# Patient Record
Sex: Female | Born: 1975 | Race: White | Hispanic: No | Marital: Married | Smoking: Never smoker
Health system: Southern US, Community
[De-identification: ages and names within clinical notes are randomized; demographics above are authoritative.]

## PROBLEM LIST (undated history)

## (undated) DIAGNOSIS — J302 Other seasonal allergic rhinitis: Secondary | ICD-10-CM

## (undated) DIAGNOSIS — J45909 Unspecified asthma, uncomplicated: Secondary | ICD-10-CM

## (undated) HISTORY — PX: DILATION AND EVACUATION: SHX1459

## (undated) HISTORY — PX: TONSILLECTOMY: SUR1361

---

## 2017-09-11 DIAGNOSIS — J309 Allergic rhinitis, unspecified: Secondary | ICD-10-CM | POA: Diagnosis not present

## 2017-09-11 DIAGNOSIS — Z79899 Other long term (current) drug therapy: Secondary | ICD-10-CM | POA: Diagnosis not present

## 2017-09-11 DIAGNOSIS — J4520 Mild intermittent asthma, uncomplicated: Secondary | ICD-10-CM | POA: Diagnosis not present

## 2018-02-20 DIAGNOSIS — H60312 Diffuse otitis externa, left ear: Secondary | ICD-10-CM | POA: Diagnosis not present

## 2018-03-05 DIAGNOSIS — L409 Psoriasis, unspecified: Secondary | ICD-10-CM | POA: Diagnosis not present

## 2018-03-12 DIAGNOSIS — L409 Psoriasis, unspecified: Secondary | ICD-10-CM | POA: Diagnosis not present

## 2018-03-12 DIAGNOSIS — Z0000 Encounter for general adult medical examination without abnormal findings: Secondary | ICD-10-CM | POA: Diagnosis not present

## 2018-03-19 DIAGNOSIS — E785 Hyperlipidemia, unspecified: Secondary | ICD-10-CM | POA: Diagnosis not present

## 2018-03-19 DIAGNOSIS — Z0000 Encounter for general adult medical examination without abnormal findings: Secondary | ICD-10-CM | POA: Diagnosis not present

## 2018-03-19 DIAGNOSIS — J45909 Unspecified asthma, uncomplicated: Secondary | ICD-10-CM | POA: Diagnosis not present

## 2018-03-19 DIAGNOSIS — L409 Psoriasis, unspecified: Secondary | ICD-10-CM | POA: Diagnosis not present

## 2019-02-17 ENCOUNTER — Ambulatory Visit (HOSPITAL_COMMUNITY)
Admission: EM | Admit: 2019-02-17 | Discharge: 2019-02-17 | Disposition: A | Discharge status: Home / Self Care | Payer: BLUE CROSS/BLUE SHIELD | Attending: Emergency Medicine | Admitting: Emergency Medicine

## 2019-02-17 ENCOUNTER — Encounter (HOSPITAL_COMMUNITY): Payer: Self-pay

## 2019-02-17 ENCOUNTER — Other Ambulatory Visit: Payer: Self-pay

## 2019-02-17 DIAGNOSIS — H60312 Diffuse otitis externa, left ear: Secondary | ICD-10-CM | POA: Diagnosis not present

## 2019-02-17 HISTORY — DX: Unspecified asthma, uncomplicated: J45.909

## 2019-02-17 MED ORDER — NEOMYCIN-POLYMYXIN-HC 3.5-10000-1 OT SUSP: 4.0000 [drp] | Freq: Three times a day (TID) | OTIC | 0 refills | Status: DC

## 2019-02-17 NOTE — Discharge Instructions (Addendum)
Eardrops daily as instructed. Return if ear swelling, pain persists, you develop fever, jaw pain, eye pain, lose hearing. To help prevent ear infections, you may use a 1 part white vinegar and 1 part isopropyl (rubbing) alcohol solution daily to help keep ears clean and dry.  Do not stick Q-tips in your ears.

## 2019-02-17 NOTE — ED Provider Notes (Signed)
[name]    CSN: [id number] Arrival date & time: 02/17/19  1106     History   Chief Complaint Chief Complaint  Patient presents with  . Otalgia    HPI Alice Dominguez is a 43 y.o. female with insignificant medical history presenting for acute left ear pain.  Patient states this is the second day she had pain.  Patient endorses muffled hearing, denies tinnitus or dizziness.  Patient states that for the last few year she tends to get out ear infections annually.  Patient was seen by ENT for similar visit on 02/20/2018.  Given Corticosporin drops which alleviated her symptoms.  Patient states she tolerated this medication well, denies adverse effects.  Patient denies fever, head pain, eye pain, change in vision, jaw pain, difficulty swallowing.  Denies foreign body, recent swimming.    Past Medical History:  Diagnosis Date  . Asthma     There are no active problems to display for this patient.   History reviewed. No pertinent surgical history.  OB History   No obstetric history on file.      Home Medications    Prior to Admission medications   Medication Sig Start Date End Date Taking? Authorizing Provider  neomycin-polymyxin-hydrocortisone (CORTISPORIN) 3.5-10000-1 OTIC suspension Place 4 drops into the left ear 3 (three) times daily. 02/17/19   Hall-Potvin, [place], PA-C    Family History History reviewed. No pertinent family history.  Social History Social History   Tobacco Use  . Smoking status: Never Smoker  . Smokeless tobacco: Never Used  Substance Use Topics  . Alcohol use: Never    Frequency: Never  . Drug use: Never     Allergies   Patient has no known allergies.   Review of Systems Review of Systems as per HPI   Physical Exam Triage Vital Signs ED Triage Vitals  Enc Vitals Group     BP 02/17/19 1135 119/76     Pulse Rate 02/17/19 1135 86     Resp 02/17/19 1135 18     Temp 02/17/19 1135 98.9 F (37.2 C)     Temp Source  02/17/19 1135 Oral     SpO2 02/17/19 1135 99 %     Weight 02/17/19 1132 176 lb 5.9 oz (80 kg)     Height --      Head Circumference --      Peak Flow --      Pain Score 02/17/19 1132 8     Pain Loc --      Pain Edu? --      Excl. in [place]? --    No data found.  Updated Vital Signs BP 119/76 (BP Location: Right Arm)   Pulse 86   Temp 98.9 F (37.2 C) (Oral)   Resp 18   Wt 176 lb 5.9 oz (80 kg)   LMP 01/21/2019   SpO2 99%   Visual Acuity Right Eye Distance:   Left Eye Distance:   Bilateral Distance:    Right Eye Near:   Left Eye Near:    Bilateral Near:     Physical Exam Constitutional:      General: She is not in acute distress. HENT:     Head: Normocephalic and atraumatic.     Right Ear: Tympanic membrane, ear canal and external ear normal. There is no impacted cerumen.     Left Ear: There is no impacted cerumen.     Ears:     Comments: Right ear unremarkable.  Left  ear positive tragal tenderness, EAC swelling.  TM not injected, no perforation holding.    Nose: Nose normal.     Mouth/Throat:     Mouth: Mucous membranes are moist.     Pharynx: Oropharynx is clear. No oropharyngeal exudate or posterior oropharyngeal erythema.     Comments: Tonsils surgically sent, uvula midline rises symmetrically. Eyes:     Extraocular Movements: Extraocular movements intact.     Pupils: Pupils are equal, round, and reactive to light.  Neck:     Musculoskeletal: Normal range of motion and neck supple. No muscular tenderness.  Cardiovascular:     Rate and Rhythm: Normal rate.  Pulmonary:     Effort: Pulmonary effort is normal.  Lymphadenopathy:     Cervical: No cervical adenopathy.  Neurological:     Mental Status: She is alert.      UC Treatments / Results  Labs (all labs ordered are listed, but only abnormal results are displayed) Labs Reviewed - No data to display  EKG None  Radiology No results found.  Procedures Procedures (including critical care time)   Medications Ordered in UC Medications - No data to display  Initial Impression / Assessment and Plan / UC Course  I have reviewed the triage vital signs and the nursing notes.  Pertinent labs & imaging results that were available during my care of the patient were reviewed by me and considered in my medical decision making (see chart for details).     43 year old female with acute left-sided ear pain.  Patient states she is get these annually.  Has responded well to Corticosporin drops.  TM without perforation.  Discussed return precautions, eardrops given. Final Clinical Impressions(s) / UC Diagnoses   Final diagnoses:  Acute diffuse otitis externa of left ear     Discharge Instructions     Eardrops daily as instructed. Return if ear swelling, pain persists, you develop fever, jaw pain, eye pain, lose hearing. To help prevent ear infections, you may use a 1 part white vinegar and 1 part isopropyl (rubbing) alcohol solution daily to help keep ears clean and dry.  Do not stick Q-tips in your ears.    ED Prescriptions    Medication Sig Dispense Auth. Provider   neomycin-polymyxin-hydrocortisone (CORTISPORIN) 3.5-10000-1 OTIC suspension Place 4 drops into the left ear 3 (three) times daily. 10 mL Hall-Potvin, [place], PA-C     Controlled Substance Prescriptions [place] Controlled Substance Registry consulted? Not Applicable   Alice Dominguez, [place] 02/17/19 1155

## 2019-02-17 NOTE — ED Notes (Signed)
Patient able to ambulate independently  

## 2019-02-17 NOTE — ED Triage Notes (Signed)
Pt cc she has left ear pain. Pt states she could not sleep for the pain. Pt states its swollen. X 2 days

## 2019-02-19 ENCOUNTER — Ambulatory Visit (HOSPITAL_COMMUNITY)
Admission: EM | Admit: 2019-02-19 | Discharge: 2019-02-19 | Disposition: A | Discharge status: Home / Self Care | Payer: BLUE CROSS/BLUE SHIELD | Attending: Family Medicine | Admitting: Family Medicine

## 2019-02-19 ENCOUNTER — Encounter (HOSPITAL_COMMUNITY): Payer: Self-pay | Admitting: Emergency Medicine

## 2019-02-19 ENCOUNTER — Other Ambulatory Visit: Payer: Self-pay

## 2019-02-19 DIAGNOSIS — H60502 Unspecified acute noninfective otitis externa, left ear: Secondary | ICD-10-CM

## 2019-02-19 DIAGNOSIS — H9202 Otalgia, left ear: Secondary | ICD-10-CM

## 2019-02-19 MED ORDER — CIPROFLOXACIN HCL 500 MG PO TABS: 500.0000 mg | ORAL_TABLET | Freq: Two times a day (BID) | ORAL | 0 refills | Status: DC

## 2019-02-19 MED ORDER — HYDROCODONE-ACETAMINOPHEN 7.5-325 MG PO TABS: 1.0000 | ORAL_TABLET | Freq: Four times a day (QID) | ORAL | 0 refills | Status: DC | PRN

## 2019-02-19 NOTE — Discharge Instructions (Addendum)
Continue the eardrops Add Cipro 2 times a day Take Tylenol or ibuprofen for mild to moderate pain Take hydrocodone if pain is severe Hydrocodone can cause drowsiness, do not take this pill and drive See your ear nose and throat doctor next week Call if you do not start to see improvement within 2 to 3 days

## 2019-02-19 NOTE — ED Provider Notes (Signed)
[place]    CSN: [id number] Arrival date & time: 02/19/19  1837     History   Chief Complaint Chief Complaint  Patient presents with  . Otalgia    HPI Alice Dominguez is a 43 y.o. female.   HPI  Seen 3 d ago for OE Is using the cortisporin gtts as directed Is getting worse Is having increased pain.  Increased redness and swelling of the ear.  Increased drainage.  Decreased hearing.  She states the pain is keeping her awake at night.  The drops do not seem to be working  Past Medical History:  Diagnosis Date  . Asthma     There are no active problems to display for this patient.   Past Surgical History:  Procedure Laterality Date  . TONSILLECTOMY      OB History   No obstetric history on file.      Home Medications    Prior to Admission medications   Medication Sig Start Date End Date Taking? Authorizing Provider  ciprofloxacin (CIPRO) 500 MG tablet Take 1 tablet (500 mg total) by mouth every 12 (twelve) hours. 02/19/19   Raylene Everts, MD  HYDROcodone-acetaminophen ([place]) 7.5-325 MG tablet Take 1 tablet by mouth every 6 (six) hours as needed for moderate pain. 02/19/19   Raylene Everts, MD  neomycin-polymyxin-hydrocortisone (CORTISPORIN) 3.5-10000-1 OTIC suspension Place 4 drops into the left ear 3 (three) times daily. 02/17/19   Hall-Potvin, [place], PA-C    Family History History reviewed. No pertinent family history.  Social History Social History   Tobacco Use  . Smoking status: Never Smoker  . Smokeless tobacco: Never Used  Substance Use Topics  . Alcohol use: Never    Frequency: Never  . Drug use: Never     Allergies   Patient has no known allergies.   Review of Systems Review of Systems  Constitutional: Negative for chills and fever.  HENT: Positive for ear discharge, ear pain and hearing loss. Negative for sore throat.   Eyes: Negative for pain and visual disturbance.  Respiratory: Negative for cough and  shortness of breath.   Cardiovascular: Negative for chest pain and palpitations.  Gastrointestinal: Negative for abdominal pain and vomiting.  Genitourinary: Negative for dysuria and hematuria.  Musculoskeletal: Negative for arthralgias and back pain.  Skin: Negative for color change and rash.  Neurological: Negative for seizures and syncope.  All other systems reviewed and are negative.    Physical Exam Triage Vital Signs ED Triage Vitals  Enc Vitals Group     BP 02/19/19 1848 131/63     Pulse Rate 02/19/19 1848 73     Resp 02/19/19 1848 18     Temp 02/19/19 1848 98.4 F (36.9 C)     Temp Source 02/19/19 1848 Oral     SpO2 02/19/19 1848 100 %     Weight --      Height --      Head Circumference --      Peak Flow --      Pain Score 02/19/19 1846 9     Pain Loc --      Pain Edu? --      Excl. in [place]? --    No data found.  Updated Vital Signs BP 131/63 (BP Location: Right Arm)   Pulse 73   Temp 98.4 F (36.9 C) (Oral)   Resp 18   LMP 01/21/2019   SpO2 100%       Physical Exam Constitutional:  General: She is not in acute distress.    Appearance: She is well-developed.  HENT:     Head: Normocephalic and atraumatic.     Right Ear: Tympanic membrane, ear canal and external ear normal.     Left Ear: Tympanic membrane normal. There is no impacted cerumen.     Ears:      Nose: Nose normal. No congestion.     Mouth/Throat:     Mouth: Mucous membranes are moist.     Pharynx: No posterior oropharyngeal erythema.  Eyes:     Conjunctiva/sclera: Conjunctivae normal.     Pupils: Pupils are equal, round, and reactive to light.  Neck:     Musculoskeletal: Normal range of motion.     Comments: Left anterior cervical node Cardiovascular:     Rate and Rhythm: Normal rate.  Pulmonary:     Effort: Pulmonary effort is normal. No respiratory distress.  Abdominal:     General: There is no distension.     Palpations: Abdomen is soft.  Musculoskeletal: Normal range of  motion.  Lymphadenopathy:     Cervical: Cervical adenopathy present.  Skin:    General: Skin is warm and dry.  Neurological:     General: No focal deficit present.     Mental Status: She is alert.  Psychiatric:        Mood and Affect: Mood normal.        Behavior: Behavior normal.      UC Treatments / Results  Labs (all labs ordered are listed, but only abnormal results are displayed) Labs Reviewed - No data to display  EKG None  Radiology No results found.  Procedures Procedures (including critical care time)  Medications Ordered in UC Medications - No data to display  Initial Impression / Assessment and Plan / UC Course  I have reviewed the triage vital signs and the nursing notes.  Pertinent labs & imaging results that were available during my care of the patient were reviewed by me and considered in my medical decision making (see chart for details).     Otitis externa with failure of Cortisporin.  Will add Cipro.  Pain management.  Patient has an appointment with her ENT on 6 /1 /2020.  She will call us if she fails to see improvement over the next couple of days Final Clinical Impressions(s) / UC Diagnoses   Final diagnoses:  Acute otitis externa of left ear, unspecified type  Left ear pain     Discharge Instructions     Continue the eardrops Add Cipro 2 times a day Take Tylenol or ibuprofen for mild to moderate pain Take hydrocodone if pain is severe Hydrocodone can cause drowsiness, do not take this pill and drive See your ear nose and throat doctor next week Call if you do not start to see improvement within 2 to 3 days   ED Prescriptions    Medication Sig Dispense Auth. Provider   ciprofloxacin (CIPRO) 500 MG tablet Take 1 tablet (500 mg total) by mouth every 12 (twelve) hours. 14 tablet Raylene Everts, MD   HYDROcodone-acetaminophen [facility]) 7.5-325 MG tablet Take 1 tablet by mouth every 6 (six) hours as needed for moderate pain. 15 tablet  Raylene Everts, MD     Controlled Substance Prescriptions [place] Controlled Substance Registry consulted? Yes, I have consulted the [place] Controlled Substances Registry for this patient, and feel the risk/benefit ratio today is favorable for proceeding with this prescription for a controlled substance.   Meda Coffee,  Jennette Banker, MD 02/19/19 [phone]

## 2019-02-19 NOTE — ED Triage Notes (Signed)
Left ear pain, difficulty hearing that started on Saturday.  Seen at ucc on Sunday.  Symptoms have worsened.

## 2019-02-25 DIAGNOSIS — H60312 Diffuse otitis externa, left ear: Secondary | ICD-10-CM | POA: Diagnosis not present

## 2019-10-29 ENCOUNTER — Other Ambulatory Visit: Payer: Self-pay | Admitting: Family Medicine

## 2019-10-29 DIAGNOSIS — Z1231 Encounter for screening mammogram for malignant neoplasm of breast: Secondary | ICD-10-CM

## 2019-12-04 ENCOUNTER — Ambulatory Visit
Admission: RE | Admit: 2019-12-04 | Discharge: 2019-12-04 | Disposition: A | Discharge status: Home / Self Care | Payer: BC Managed Care – PPO | Source: Ambulatory Visit | Attending: Family Medicine | Admitting: Family Medicine

## 2019-12-04 ENCOUNTER — Other Ambulatory Visit: Payer: Self-pay

## 2019-12-04 DIAGNOSIS — Z1231 Encounter for screening mammogram for malignant neoplasm of breast: Secondary | ICD-10-CM

## 2019-12-07 ENCOUNTER — Ambulatory Visit (HOSPITAL_COMMUNITY)
Admission: EM | Admit: 2019-12-07 | Discharge: 2019-12-07 | Disposition: A | Discharge status: Home / Self Care | Payer: BC Managed Care – PPO | Attending: Emergency Medicine | Admitting: Emergency Medicine

## 2019-12-07 ENCOUNTER — Other Ambulatory Visit: Payer: Self-pay

## 2019-12-07 ENCOUNTER — Encounter (HOSPITAL_COMMUNITY): Payer: Self-pay | Admitting: *Deleted

## 2019-12-07 DIAGNOSIS — H02843 Edema of right eye, unspecified eyelid: Secondary | ICD-10-CM | POA: Diagnosis not present

## 2019-12-07 DIAGNOSIS — T7840XA Allergy, unspecified, initial encounter: Secondary | ICD-10-CM | POA: Diagnosis not present

## 2019-12-07 DIAGNOSIS — H02846 Edema of left eye, unspecified eyelid: Secondary | ICD-10-CM | POA: Diagnosis not present

## 2019-12-07 MED ORDER — PREDNISONE 10 MG (21) PO TBPK: ORAL_TABLET | ORAL | 0 refills | Status: DC

## 2019-12-07 MED ORDER — OLOPATADINE HCL 0.2 % OP SOLN: 1.0000 [drp] | Freq: Every day | OPHTHALMIC | 0 refills | Status: DC

## 2019-12-07 NOTE — ED Provider Notes (Signed)
HPI  SUBJECTIVE:  Alice Dominguez is a 44 y.o. female who presents with bilateral eyelid erythema, swelling, dry skin.  She reports burning, itching periorbital pain.  No sneezing nasal congestion or rhinorrhea.  No new lotions soaps exposure to chemicals.  States that she is not rubbing her eyes.  No photophobia, eye pain.  No visual changes nausea vomiting fevers headaches.  No tongue or lip swelling, rash urticaria difficulty breathing wheezing. she has had symptoms like this before that lasted a year and no cause was found.  She has tried Claritin without improvement in her symptoms.  Symptoms are worse with going outside.  She has a past medical history of allergies which are worse in the spring, asthma.  No history of diabetes hypertension.  LMP: Beginning of March.  Denies the possibility of being pregnant.  PMD: Dr. Theda Sers.  Past Medical History:  Diagnosis Date   Asthma     Past Surgical History:  Procedure Laterality Date   DILATION AND EVACUATION     miscarriage   TONSILLECTOMY      Family History  Problem Relation Age of Onset   Healthy Mother    Healthy Father     Social History   Tobacco Use   Smoking status: Never Smoker   Smokeless tobacco: Never Used  Substance Use Topics   Alcohol use: Never   Drug use: Never    No current facility-administered medications for this encounter.  Current Outpatient Medications:    HYDROcodone-acetaminophen (NORCO) 7.5-325 MG tablet, Take 1 tablet by mouth every 6 (six) hours as needed for moderate pain., Disp: 15 tablet, Rfl: 0   Olopatadine HCl 0.2 % SOLN, Apply 1 drop to eye daily., Disp: 2.5 mL, Rfl: 0   predniSONE (STERAPRED UNI-PAK 21 TAB) 10 MG (21) TBPK tablet, Dispense one 6 day pack. Take as directed with food., Disp: 21 tablet, Rfl: 0  Allergies  Allergen Reactions   Penicillins Rash     ROS  As noted in HPI.   Physical Exam  BP 121/83 (BP Location: Left Arm)    Pulse 83    Temp 98.7 F  (37.1 C) (Oral)    Resp 18    LMP 11/25/2019    SpO2 97%   Constitutional: Well developed, well nourished, no acute distress Eyes:  EOMI, conjunctiva normal bilaterally, PERRLA, no photophobia.  Bilateral mildly tender erythema edema of the upper eyelids and inferior to the eye.  No rash, induration    HENT: Normocephalic, atraumatic,mucus membranes moist no angioedema of lips tongue or uvula Respiratory: Normal inspiratory effort lungs clear bilaterally Cardiovascular: Normal rate GI: nondistended skin: No rash, skin intact Musculoskeletal: no deformities Neurologic: Alert & oriented x 3, no focal neuro deficits Psychiatric: Speech and behavior appropriate   ED Course   Medications - No data to display  No orders of the defined types were placed in this encounter.   No results found for this or any previous visit (from the past 24 hour(s)). No results found.  ED Clinical Impression  1. Allergic reaction, initial encounter   2. Edema of eyelid of both eyes      ED Assessment/Plan  Presentation consistent with allergies.  Doubt periorbital cellulitis as it is bilateral.  Will send home with Allegra cool compresses Patanol, 6-day prednisone taper. Follow-up with PMD as needed, to the ER if she gets worse.  Discussed MDM, treatment plan, and plan for follow-up with patient. Discussed sn/sx that should prompt return to the ED. patient agrees  with plan.   Meds ordered this encounter  Medications   Olopatadine HCl 0.2 % SOLN    Sig: Apply 1 drop to eye daily.    Dispense:  2.5 mL    Refill:  0   predniSONE (STERAPRED UNI-PAK 21 TAB) 10 MG (21) TBPK tablet    Sig: Dispense one 6 day pack. Take as directed with food.    Dispense:  21 tablet    Refill:  0    *This clinic note was created using [employer]. Therefore, there may be occasional mistakes despite careful proofreading.   ?    Melynda Ripple, MD 12/07/19 1224

## 2019-12-07 NOTE — ED Triage Notes (Signed)
Pt reports getting seasonal allergies during spring.  Reports starting with bilat swollen eyes, eye redness progressively worsening over past week.  Has started using Claritin without relief.

## 2019-12-07 NOTE — Discharge Instructions (Addendum)
Stop the Claritin and try Allegra, cool compresses, Pataday, 6-day prednisone taper.

## 2019-12-16 ENCOUNTER — Encounter (HOSPITAL_COMMUNITY): Payer: Self-pay | Admitting: Emergency Medicine

## 2019-12-16 ENCOUNTER — Ambulatory Visit (HOSPITAL_COMMUNITY)
Admission: EM | Admit: 2019-12-16 | Discharge: 2019-12-16 | Disposition: A | Discharge status: Home / Self Care | Payer: BC Managed Care – PPO | Attending: Physician Assistant | Admitting: Physician Assistant

## 2019-12-16 ENCOUNTER — Other Ambulatory Visit: Payer: Self-pay

## 2019-12-16 DIAGNOSIS — H1013 Acute atopic conjunctivitis, bilateral: Secondary | ICD-10-CM | POA: Diagnosis not present

## 2019-12-16 MED ORDER — MONTELUKAST SODIUM 10 MG PO TABS: 10.0000 mg | ORAL_TABLET | Freq: Every day | ORAL | 0 refills | Status: DC

## 2019-12-16 MED ORDER — HYDROXYZINE HCL 25 MG PO TABS: 25.0000 mg | ORAL_TABLET | Freq: Three times a day (TID) | ORAL | 0 refills | Status: AC | PRN

## 2019-12-16 NOTE — ED Triage Notes (Signed)
Pt sts seasonal allergies during the spring; pt sts eyes are red, puffy and watering; pt sts hx of same; pt seen here recently for same

## 2019-12-16 NOTE — Discharge Instructions (Signed)
Take the Singulair once a day Use the hydroxyzine for itching, this may make you drowsy so primarily uses at night  Please follow-up with your primary care for reevaluation in 1 to 2 weeks  If swelling is severely worsening in 1 week please return for reevaluation or follow-up with primary care

## 2019-12-16 NOTE — ED Provider Notes (Signed)
[place]    CSN: [id number] Arrival date & time: 12/16/19  [id number]      History   Chief Complaint Chief Complaint  Patient presents with  . Allergic Reaction    HPI Alice Dominguez is a 44 y.o. female.   Patient is here for evaluation of eyelid redness and swelling.  She was here on 12/07/2019 for similar.  Patient reports finishing her prednisone taper on schedule.  She reports immediate improvement in her redness and swelling around her eyes after 2 days of prednisone treatment.  She reports 2 to 3 days after finishing her treatment swelling and redness return.  She does endorse some irritation and has tried to not scratch her eyes.  Denies change in her vision.  She denies eye watering or discharge.  She denies fever or chills.  She reports similar episodes many years ago that was allergy driven.  She reports she different specialists and no one was able to give her a reason why it happened.  She reports that after that summer concluded her symptoms never returned.     Past Medical History:  Diagnosis Date  . Asthma     There are no problems to display for this patient.   Past Surgical History:  Procedure Laterality Date  . DILATION AND EVACUATION     miscarriage  . TONSILLECTOMY      OB History   No obstetric history on file.      Home Medications    Prior to Admission medications   Medication Sig Start Date End Date Taking? Authorizing Provider  HYDROcodone-acetaminophen (NORCO) 7.5-325 MG tablet Take 1 tablet by mouth every 6 (six) hours as needed for moderate pain. Patient not taking: Reported on 12/16/2019 02/19/19   Raylene Everts, MD  hydrOXYzine (ATARAX/VISTARIL) 25 MG tablet Take 1 tablet (25 mg total) by mouth every 8 (eight) hours as needed for up to 10 days for itching. 12/16/19 12/26/19  Kostantinos Tallman, Marguerita Beards, PA-C  montelukast (SINGULAIR) 10 MG tablet Take 1 tablet (10 mg total) by mouth at bedtime. 12/16/19 01/15/20  Shardea Cwynar, Marguerita Beards, PA-C    Olopatadine HCl 0.2 % SOLN Apply 1 drop to eye daily. 12/07/19   Melynda Ripple, MD  predniSONE (STERAPRED UNI-PAK 21 TAB) 10 MG (21) TBPK tablet Dispense one 6 day pack. Take as directed with food. 12/07/19   Melynda Ripple, MD    Family History Family History  Problem Relation Age of Onset  . Healthy Mother   . Healthy Father     Social History Social History   Tobacco Use  . Smoking status: Never Smoker  . Smokeless tobacco: Never Used  Substance Use Topics  . Alcohol use: Never  . Drug use: Never     Allergies   Penicillins   Review of Systems Review of Systems  Constitutional: Negative for chills and fever.  Eyes: Positive for redness and itching. Negative for photophobia, pain, discharge and visual disturbance.  Respiratory: Negative for cough and shortness of breath.   Neurological: Negative for headaches.     Physical Exam Triage Vital Signs ED Triage Vitals  Enc Vitals Group     BP 12/16/19 1046 128/85     Pulse Rate 12/16/19 1046 79     Resp 12/16/19 1046 18     Temp 12/16/19 1046 98.4 F (36.9 C)     Temp Source 12/16/19 1046 Oral     SpO2 12/16/19 1046 97 %     Weight --  Height --      Head Circumference --      Peak Flow --      Pain Score 12/16/19 1047 3     Pain Loc --      Pain Edu? --      Excl. in [place]? --    No data found.  Updated Vital Signs BP 128/85 (BP Location: Right Arm)   Pulse 79   Temp 98.4 F (36.9 C) (Oral)   Resp 18   LMP 11/25/2019   SpO2 97%   Visual Acuity Right Eye Distance:   Left Eye Distance:   Bilateral Distance:    Right Eye Near:   Left Eye Near:    Bilateral Near:     Physical Exam Vitals and nursing note reviewed.  Constitutional:      General: She is not in acute distress.    Appearance: She is well-developed.  HENT:     Head: Normocephalic and atraumatic.  Eyes:     Conjunctiva/sclera: Conjunctivae normal.     Comments: There is erythema and mild swelling around both eyes to  include the superior eyelid and skin lateral through eyes.  There is no scleral injection or chemosis.  Nontender to palpation.  See note from 12/07/2019 for image as this is very similar  Cardiovascular:     Rate and Rhythm: Normal rate.     Heart sounds: No murmur.  Pulmonary:     Effort: Pulmonary effort is normal. No respiratory distress.  Musculoskeletal:     Cervical back: Neck supple.     Right lower leg: No edema.     Left lower leg: No edema.  Skin:    General: Skin is warm and dry.  Neurological:     Mental Status: She is alert.      UC Treatments / Results  Labs (all labs ordered are listed, but only abnormal results are displayed) Labs Reviewed - No data to display  EKG   Radiology No results found.  Procedures Procedures (including critical care time)  Medications Ordered in UC Medications - No data to display  Initial Impression / Assessment and Plan / UC Course  I have reviewed the triage vital signs and the nursing notes.  Pertinent labs & imaging results that were available during my care of the patient were reviewed by me and considered in my medical decision making (see chart for details).     #Allergy Patient is a 44 year old presenting with eye redness and swelling that is most likely consistent with allergy and histamine response.  Unclear exactly what is driving this however do believe there is some aspect of mechanical irritation causing skin irritation and swelling around the eyes.  We will treat the allergy with Singulair as antihistamine treatment has not greatly improved symptoms.  Do not want patient to be on long-term steroids at this time and will avoid that given recent steroid use. -Hydroxyzine for itching as well at night -Return precautions were discussed patient verbalized understanding -Patient to establish primary care  Case discussed and patient examined with supervising physician.  Plan of care was also discussed with supervising  physician.   Final Clinical Impressions(s) / UC Diagnoses   Final diagnoses:  Allergic conjunctivitis of both eyes     Discharge Instructions     Take the Singulair once a day Use the hydroxyzine for itching, this may make you drowsy so primarily uses at night  Please follow-up with your primary care for reevaluation in 1  to 2 weeks  If swelling is severely worsening in 1 week please return for reevaluation or follow-up with primary care      ED Prescriptions    Medication Sig Dispense Auth. Provider   montelukast (SINGULAIR) 10 MG tablet Take 1 tablet (10 mg total) by mouth at bedtime. 30 tablet Amanuel Sinkfield, Marguerita Beards, PA-C   hydrOXYzine (ATARAX/VISTARIL) 25 MG tablet Take 1 tablet (25 mg total) by mouth every 8 (eight) hours as needed for up to 10 days for itching. 12 tablet Trini Soldo, Marguerita Beards, PA-C     I have reviewed the PDMP during this encounter.   Purnell Shoemaker, PA-C 12/16/19 1534

## 2020-01-07 DIAGNOSIS — Z23 Encounter for immunization: Secondary | ICD-10-CM | POA: Diagnosis not present

## 2020-01-07 DIAGNOSIS — J45909 Unspecified asthma, uncomplicated: Secondary | ICD-10-CM | POA: Diagnosis not present

## 2020-01-07 DIAGNOSIS — H1013 Acute atopic conjunctivitis, bilateral: Secondary | ICD-10-CM | POA: Diagnosis not present

## 2020-02-12 DIAGNOSIS — Z01419 Encounter for gynecological examination (general) (routine) without abnormal findings: Secondary | ICD-10-CM | POA: Diagnosis not present

## 2020-02-12 DIAGNOSIS — Z1151 Encounter for screening for human papillomavirus (HPV): Secondary | ICD-10-CM | POA: Diagnosis not present

## 2020-02-12 DIAGNOSIS — Z6827 Body mass index (BMI) 27.0-27.9, adult: Secondary | ICD-10-CM | POA: Diagnosis not present

## 2020-04-19 ENCOUNTER — Encounter (HOSPITAL_COMMUNITY): Payer: Self-pay

## 2020-04-19 ENCOUNTER — Ambulatory Visit (HOSPITAL_COMMUNITY)
Admission: EM | Admit: 2020-04-19 | Discharge: 2020-04-19 | Disposition: A | Discharge status: Home / Self Care | Payer: BC Managed Care – PPO | Attending: Family Medicine | Admitting: Family Medicine

## 2020-04-19 DIAGNOSIS — I889 Nonspecific lymphadenitis, unspecified: Secondary | ICD-10-CM | POA: Diagnosis not present

## 2020-04-19 DIAGNOSIS — H60392 Other infective otitis externa, left ear: Secondary | ICD-10-CM | POA: Diagnosis not present

## 2020-04-19 MED ORDER — CIPROFLOXACIN-DEXAMETHASONE 0.3-0.1 % OT SUSP: 4.0000 [drp] | Freq: Two times a day (BID) | OTIC | 0 refills | Status: DC

## 2020-04-19 MED ORDER — CEFDINIR 300 MG PO CAPS: 300.0000 mg | ORAL_CAPSULE | Freq: Two times a day (BID) | ORAL | 0 refills | Status: DC

## 2020-04-19 MED ORDER — CEFDINIR 300 MG PO CAPS: 300.0000 mg | ORAL_CAPSULE | Freq: Two times a day (BID) | ORAL | 0 refills | Status: AC

## 2020-04-19 NOTE — ED Provider Notes (Signed)
[place]   [id number] 04/19/20 Arrival Time: 1330  CC: EAR PAIN  SUBJECTIVE: History from: patient.  Alice Dominguez is a 44 y.o. female who presents with left ear pain for the last 2 days. Reports history of ear infections. Reports that she has used OTC ear drops with no relief. Denies a precipitating event, such as swimming or wearing ear plugs. Patient states the pain is constant and achy in character.  Denies fever, chills, fatigue, sinus pain, rhinorrhea, ear discharge, sore throat, SOB, wheezing, chest pain, nausea, changes in bowel or bladder habits.    ROS: As per HPI.  All other pertinent ROS negative.     Past Medical History:  Diagnosis Date  . Asthma    Past Surgical History:  Procedure Laterality Date  . DILATION AND EVACUATION     miscarriage  . TONSILLECTOMY     Allergies  Allergen Reactions  . Penicillins Rash   No current facility-administered medications on file prior to encounter.   Current Outpatient Medications on File Prior to Encounter  Medication Sig Dispense Refill  . HYDROcodone-acetaminophen (NORCO) 7.5-325 MG tablet Take 1 tablet by mouth every 6 (six) hours as needed for moderate pain. (Patient not taking: Reported on 12/16/2019) 15 tablet 0  . montelukast (SINGULAIR) 10 MG tablet Take 1 tablet (10 mg total) by mouth at bedtime. 30 tablet 0  . Olopatadine HCl 0.2 % SOLN Apply 1 drop to eye daily. 2.5 mL 0  . predniSONE (STERAPRED UNI-PAK 21 TAB) 10 MG (21) TBPK tablet Dispense one 6 day pack. Take as directed with food. 21 tablet 0   Social History   Socioeconomic History  . Marital status: Married    Spouse name: Not on file  . Number of children: Not on file  . Years of education: Not on file  . Highest education level: Not on file  Occupational History  . Not on file  Tobacco Use  . Smoking status: Never Smoker  . Smokeless tobacco: Never Used  Substance and Sexual Activity  . Alcohol use: Never  . Drug use: Never  .  Sexual activity: Not on file  Other Topics Concern  . Not on file  Social History Narrative  . Not on file   Social Determinants of Health   Financial Resource Strain:   . Difficulty of Paying Living Expenses:   Food Insecurity:   . Worried About Charity fundraiser in the Last Year:   . Arboriculturist in the Last Year:   Transportation Needs:   . Film/video editor (Medical):   Marland Kitchen Lack of Transportation (Non-Medical):   Physical Activity:   . Days of Exercise per Week:   . Minutes of Exercise per Session:   Stress:   . Feeling of Stress :   Social Connections:   . Frequency of Communication with Friends and Family:   . Frequency of Social Gatherings with Friends and Family:   . Attends Religious Services:   . Active Member of Clubs or Organizations:   . Attends Archivist Meetings:   Marland Kitchen Marital Status:   Intimate Partner Violence:   . Fear of Current or Ex-Partner:   . Emotionally Abused:   Marland Kitchen Physically Abused:   . Sexually Abused:    Family History  Problem Relation Age of Onset  . Healthy Mother   . Healthy Father     OBJECTIVE:  Vitals:   04/19/20 1438  BP: 119/80  Pulse: 69  Resp:  16  Temp: 98.3 F (36.8 C)  TempSrc: Oral  SpO2: 98%     General appearance: alert; appears fatigued HEENT: Ears: R EAC clear, L EAC erythematous, tender, swollen, TMs pearly gray with visible cone of light, without erythema; Eyes: PERRL, EOMI grossly; Sinuses nontender to palpation; Nose: clear rhinorrhea; Throat: oropharynx mildly erythematous, tonsils 1+ without white tonsillar exudates, uvula midline Neck: supple with LAD Lungs: unlabored respirations, symmetrical air entry; cough: absent; no respiratory distress Heart: regular rate and rhythm.  Radial pulses 2+ symmetrical bilaterally Skin: warm and dry Psychological: alert and cooperative; normal mood and affect  Imaging: No results found.   ASSESSMENT & PLAN:  1. Other infective acute otitis externa  of left ear   2. Lymphadenitis     Meds ordered this encounter  Medications  . DISCONTD: ciprofloxacin-dexamethasone (CIPRODEX) OTIC suspension    Sig: Place 4 drops into the left ear 2 (two) times daily.    Dispense:  7.5 mL    Refill:  0    Order Specific Question:   Supervising Provider    Answer:   Chase Picket [id number]  . DISCONTD: cefdinir (OMNICEF) 300 MG capsule    Sig: Take 1 capsule (300 mg total) by mouth 2 (two) times daily for 10 days.    Dispense:  20 capsule    Refill:  0    Order Specific Question:   Supervising Provider    Answer:   Chase Picket [id number]  . cefdinir (OMNICEF) 300 MG capsule    Sig: Take 1 capsule (300 mg total) by mouth 2 (two) times daily for 10 days.    Dispense:  20 capsule    Refill:  0    Order Specific Question:   Supervising Provider    Answer:   Chase Picket [id number]  . ciprofloxacin-dexamethasone (CIPRODEX) OTIC suspension    Sig: Place 4 drops into the left ear 2 (two) times daily.    Dispense:  7.5 mL    Refill:  0    Order Specific Question:   Supervising Provider    Answer:   Chase Picket [[id number]]    Rest and drink plenty of fluids Prescribed Cipro Prescribed ciprofloxacin ear drops Take medications as directed and to completion Continue to use OTC ibuprofen and/ or tylenol as needed for pain control Follow up with PCP if symptoms persists Return here or go to the ER if you have any new or worsening symptoms   Reviewed expectations re: course of current medical issues. Questions answered. Outlined signs and symptoms indicating need for more acute intervention. Patient verbalized understanding. After Visit Summary given.         Faustino Congress, NP 04/23/20 [phone]

## 2020-04-19 NOTE — Discharge Instructions (Addendum)
I have sent in Ciprodex drops for you to use 4 drops in the left ear twice daily for one week  I have also sent in Cefdinir for you to take orally twice a day for 10 days  Follow up with this office or with primary care as needed  Follow up in the ER for unrelenting pain, high fever, trouble swallowing, trouble breathing, other concerning symptoms

## 2020-04-19 NOTE — ED Triage Notes (Signed)
Pt present left ear pain, symptoms started two days ago.pt states that she has tried medication and drops without any relief.

## 2020-06-17 DIAGNOSIS — Z029 Encounter for administrative examinations, unspecified: Secondary | ICD-10-CM | POA: Diagnosis not present

## 2020-06-25 DIAGNOSIS — Z23 Encounter for immunization: Secondary | ICD-10-CM | POA: Diagnosis not present

## 2020-07-23 DIAGNOSIS — J45909 Unspecified asthma, uncomplicated: Secondary | ICD-10-CM | POA: Diagnosis not present

## 2020-07-23 DIAGNOSIS — Z23 Encounter for immunization: Secondary | ICD-10-CM | POA: Diagnosis not present

## 2020-07-23 DIAGNOSIS — K644 Residual hemorrhoidal skin tags: Secondary | ICD-10-CM | POA: Diagnosis not present

## 2020-11-13 ENCOUNTER — Encounter (HOSPITAL_COMMUNITY): Payer: Self-pay | Admitting: Emergency Medicine

## 2020-11-13 ENCOUNTER — Emergency Department (HOSPITAL_COMMUNITY)
Admission: EM | Admit: 2020-11-13 | Discharge: 2020-11-13 | Disposition: A | Discharge status: Home / Self Care | Payer: BC Managed Care – PPO | Attending: Emergency Medicine | Admitting: Emergency Medicine

## 2020-11-13 ENCOUNTER — Emergency Department (HOSPITAL_COMMUNITY): Payer: BC Managed Care – PPO

## 2020-11-13 DIAGNOSIS — R0789 Other chest pain: Secondary | ICD-10-CM | POA: Diagnosis not present

## 2020-11-13 DIAGNOSIS — Z20822 Contact with and (suspected) exposure to covid-19: Secondary | ICD-10-CM | POA: Insufficient documentation

## 2020-11-13 DIAGNOSIS — J45909 Unspecified asthma, uncomplicated: Secondary | ICD-10-CM | POA: Diagnosis not present

## 2020-11-13 DIAGNOSIS — R000 Tachycardia, unspecified: Secondary | ICD-10-CM | POA: Insufficient documentation

## 2020-11-13 DIAGNOSIS — R0602 Shortness of breath: Secondary | ICD-10-CM | POA: Diagnosis not present

## 2020-11-13 LAB — CBC WITH DIFFERENTIAL/PLATELET
Abs Immature Granulocytes: 0.02 10*3/uL (ref 0.00–0.07)
Basophils Absolute: 0.1 10*3/uL (ref 0.0–0.1)
Basophils Relative: 1 %
Eosinophils Absolute: 0.1 10*3/uL (ref 0.0–0.5)
Eosinophils Relative: 1 %
HCT: 42.8 % (ref 36.0–46.0)
Hemoglobin: 14.5 g/dL (ref 12.0–15.0)
Immature Granulocytes: 0 %
Lymphocytes Relative: 19 %
Lymphs Abs: 1.3 10*3/uL (ref 0.7–4.0)
MCH: 30.3 pg (ref 26.0–34.0)
MCHC: 33.9 g/dL (ref 30.0–36.0)
MCV: 89.4 fL (ref 80.0–100.0)
Monocytes Absolute: 0.5 10*3/uL (ref 0.1–1.0)
Monocytes Relative: 7 %
Neutro Abs: 4.9 10*3/uL (ref 1.7–7.7)
Neutrophils Relative %: 72 %
Platelets: 257 10*3/uL (ref 150–400)
RBC: 4.79 MIL/uL (ref 3.87–5.11)
RDW: 12.8 % (ref 11.5–15.5)
WBC: 6.8 10*3/uL (ref 4.0–10.5)
nRBC: 0 % (ref 0.0–0.2)

## 2020-11-13 LAB — COMPREHENSIVE METABOLIC PANEL
ALT: 25 U/L (ref 0–44)
AST: 20 U/L (ref 15–41)
Albumin: 4.5 g/dL (ref 3.5–5.0)
Alkaline Phosphatase: 61 U/L (ref 38–126)
Anion gap: 10 (ref 5–15)
BUN: 11 mg/dL (ref 6–20)
CO2: 24 mmol/L (ref 22–32)
Calcium: 9.4 mg/dL (ref 8.9–10.3)
Chloride: 105 mmol/L (ref 98–111)
Creatinine, Ser: 0.78 mg/dL (ref 0.44–1.00)
GFR, Estimated: >60 mL/min (ref >60)
Glucose, Bld: 105 mg/dL — ABNORMAL HIGH (ref 70–99)
Potassium: 4 mmol/L (ref 3.5–5.1)
Sodium: 139 mmol/L (ref 135–145)
Total Bilirubin: 0.8 mg/dL (ref 0.3–1.2)
Total Protein: 7.3 g/dL (ref 6.5–8.1)

## 2020-11-13 LAB — I-STAT BETA HCG BLOOD, ED (MC, WL, AP ONLY): I-stat hCG, quantitative: <5 m[IU]/mL (ref <5)

## 2020-11-13 LAB — SARS CORONAVIRUS 2 (TAT 6-24 HRS): SARS Coronavirus 2: NEGATIVE

## 2020-11-13 LAB — D-DIMER, QUANTITATIVE: D-Dimer, Quant: 0.43 ug/mL-FEU (ref 0.00–0.50)

## 2020-11-13 LAB — TROPONIN I (HIGH SENSITIVITY)
Troponin I (High Sensitivity): <2 ng/L (ref <18)
Troponin I (High Sensitivity): <2 ng/L (ref <18)

## 2020-11-13 NOTE — ED Provider Notes (Signed)
[facility] DEPT Provider Note   CSN: [id number] Arrival date & time: 11/13/20  1030     History Chief Complaint  Patient presents with  . Shortness of Breath    Alice Dominguez is a 45 y.o. female.  HPI      Alice Dominguez is a 45 y.o. female, with a history of asthma, presenting to the ED with shortness of breath and chest tightness beginning around 4 AM this morning. Sensation has been constant since onset.  She separates her current sensation from chest pain and states she is not having pain.  She tried using her albuterol inhaler without improvement. Covid vaccination and booster.  Denies history of PE/DVT, recent travel, recent surgery, recent trauma. Denies fever/chills, cough, N/V/D, lower extremity edema/pain, abdominal pain, back pain, syncope, or any other complaints.    Past Medical History:  Diagnosis Date  . Asthma     There are no problems to display for this patient.   Past Surgical History:  Procedure Laterality Date  . DILATION AND EVACUATION     miscarriage  . TONSILLECTOMY       OB History   No obstetric history on file.     Family History  Problem Relation Age of Onset  . Healthy Mother   . Healthy Father     Social History   Tobacco Use  . Smoking status: Never Smoker  . Smokeless tobacco: Never Used  Substance Use Topics  . Alcohol use: Never  . Drug use: Never    Home Medications Prior to Admission medications   Medication Sig Start Date End Date Taking? Authorizing Provider  ciprofloxacin-dexamethasone (CIPRODEX) OTIC suspension Place 4 drops into the left ear 2 (two) times daily. 04/19/20   Faustino Congress, NP  HYDROcodone-acetaminophen (NORCO) 7.5-325 MG tablet Take 1 tablet by mouth every 6 (six) hours as needed for moderate pain. Patient not taking: Reported on 12/16/2019 02/19/19   Raylene Everts, MD  montelukast (SINGULAIR) 10 MG tablet Take 1 tablet (10 mg total) by mouth at  bedtime. 12/16/19 01/15/20  Darr, Edison Nasuti, PA-C  Olopatadine HCl 0.2 % SOLN Apply 1 drop to eye daily. 12/07/19   Melynda Ripple, MD  predniSONE (STERAPRED UNI-PAK 21 TAB) 10 MG (21) TBPK tablet Dispense one 6 day pack. Take as directed with food. 12/07/19   Melynda Ripple, MD    Allergies    Penicillins  Review of Systems   Review of Systems  Constitutional: Negative for chills, diaphoresis and fever.  Respiratory: Positive for shortness of breath. Negative for cough.   Cardiovascular: Negative for chest pain, palpitations and leg swelling.  Gastrointestinal: Negative for abdominal pain, diarrhea, nausea and vomiting.  Neurological: Negative for dizziness, syncope and weakness.  All other systems reviewed and are negative.   Physical Exam Updated Vital Signs BP (!) 145/98 (BP Location: Right Arm)   Pulse (!) 106   Temp 98 F (36.7 C) (Oral)   Resp 20   SpO2 100%   Physical Exam Vitals and nursing note reviewed.  Constitutional:      General: She is not in acute distress.    Appearance: She is well-developed. She is not diaphoretic.  HENT:     Head: Normocephalic and atraumatic.     Mouth/Throat:     Mouth: Mucous membranes are moist.     Pharynx: Oropharynx is clear.  Eyes:     Conjunctiva/sclera: Conjunctivae normal.  Cardiovascular:     Rate and Rhythm: Regular rhythm. Tachycardia present.  Pulses: Normal pulses.          Radial pulses are 2+ on the right side and 2+ on the left side.       Posterior tibial pulses are 2+ on the right side and 2+ on the left side.     Heart sounds: Normal heart sounds.     Comments: Tactile temperature in the extremities appropriate and equal bilaterally. No lower extremity edema/pain, erythema, increased warmth. Pulmonary:     Effort: Pulmonary effort is normal. No respiratory distress.     Breath sounds: Normal breath sounds.  Abdominal:     Palpations: Abdomen is soft.     Tenderness: There is no abdominal tenderness. There  is no guarding.  Musculoskeletal:     Cervical back: Neck supple.     Right lower leg: No edema.     Left lower leg: No edema.  Skin:    General: Skin is warm and dry.  Neurological:     Mental Status: She is alert.  Psychiatric:        Mood and Affect: Mood and affect normal.        Speech: Speech normal.        Behavior: Behavior normal.     ED Results / Procedures / Treatments   Labs (all labs ordered are listed, but only abnormal results are displayed) Labs Reviewed  COMPREHENSIVE METABOLIC PANEL - Abnormal; Notable for the following components:      Result Value   Glucose, Bld 105 (*)    All other components within normal limits  SARS CORONAVIRUS 2 (TAT 6-24 HRS)  D-DIMER, QUANTITATIVE  CBC WITH DIFFERENTIAL/PLATELET  I-STAT BETA HCG BLOOD, ED ([facility], [facility], [facility] ONLY)  TROPONIN I (HIGH SENSITIVITY)  TROPONIN I (HIGH SENSITIVITY)    EKG EKG Interpretation  Date/Time:  Friday November 13 2020 10:41:05 EST Ventricular Rate:  95 PR Interval:    QRS Duration: 90 QT Interval:  341 QTC Calculation: 429 R Axis:   85 Text Interpretation: Sinus rhythm Baseline wander in lead(s) V6 12 Lead; Mason-Likar No old tracing to compare Confirmed by Lacretia Leigh ([id number]) on 11/13/2020 11:54:42 AM   Radiology DG Chest 2 View  Result Date: 11/13/2020 CLINICAL DATA:  Shortness of breath, chest tightness EXAM: CHEST - 2 VIEW COMPARISON:  None. FINDINGS: The heart size and mediastinal contours are within normal limits. Both lungs are clear. The visualized skeletal structures are unremarkable. IMPRESSION: No active cardiopulmonary disease. Electronically Signed   By: Davina Poke D.O.   On: 11/13/2020 11:09    Procedures Procedures   Medications Ordered in ED Medications - No data to display  ED Course  I have reviewed the triage vital signs and the nursing notes.  Pertinent labs & imaging results that were available during my care of the patient were reviewed by me and considered  in my medical decision making (see chart for details).    MDM Rules/Calculators/A&P                          Patient presents with chest tightness and shortness of breath beginning this morning. Patient is nontoxic appearing, afebrile, not tachypneic, not hypotensive, maintains excellent SPO2 on room air, and is in no apparent distress.  Initially, mildly tachycardic, but this spontaneously resolved later in the ED course.  I have reviewed the patient's chart to obtain more information.   I reviewed and interpreted the patient's labs and radiological studies.  Low suspicion  for ACS. HEART score is 2, indicating low risk for a cardiac event.  EKG without evidence of acute ischemia or pathologic/symptomatic arrhythmia. Wells criteria score is 1.5, indicating low risk for PE.  D-dimer negative. Delta troponins negative.  Dissection was considered, but thought less likely base on: History and description of the pain are not suggestive, patient is not ill-appearing, lack of risk factors, equal bilateral pulses, lack of neurologic deficits, no widened mediastinum on chest x-ray.  Symptoms resolved during ED course and did not recur. Offered course of steroids, patient declined. PCP and cardiology follow up.  Return precautions discussed. Patient voices understanding of these instructions, accepts the plan, and is comfortable with discharge.  Vitals:   11/13/20 1400 11/13/20 1430 11/13/20 1500 11/13/20 1530  BP: 135/72 120/70 122/72 120/71  Pulse: 81 72 73 79  Resp: 11 12 14 16   Temp:      TempSrc:      SpO2: 95% 98% 95% 96%      Final Clinical Impression(s) / ED Diagnoses Final diagnoses:  Chest tightness    Rx / DC Orders ED Discharge Orders    None       Alice Dominguez 11/13/20 1614    Lacretia Leigh, MD 11/17/20 1006

## 2020-11-13 NOTE — Discharge Instructions (Signed)
Recommend follow-up with cardiology for any further evaluation of this issue.  Call to make an appointment. Return to the emergency department for worsened pain, shortness of breath, dizziness, passing out, or any other major concerns.

## 2020-11-13 NOTE — ED Triage Notes (Signed)
Per pt, states SOB and chest tightness/discomfort starting last night-states she has asthma but symptoms do not feel like asthma

## 2020-11-25 ENCOUNTER — Encounter (HOSPITAL_COMMUNITY): Payer: Self-pay

## 2020-11-25 ENCOUNTER — Other Ambulatory Visit: Payer: Self-pay

## 2020-11-25 ENCOUNTER — Ambulatory Visit (HOSPITAL_COMMUNITY)
Admission: EM | Admit: 2020-11-25 | Discharge: 2020-11-25 | Disposition: A | Discharge status: Home / Self Care | Payer: BC Managed Care – PPO | Attending: Family Medicine | Admitting: Family Medicine

## 2020-11-25 DIAGNOSIS — R220 Localized swelling, mass and lump, head: Secondary | ICD-10-CM

## 2020-11-25 DIAGNOSIS — T7840XA Allergy, unspecified, initial encounter: Secondary | ICD-10-CM

## 2020-11-25 MED ORDER — HYDROXYZINE HCL 25 MG PO TABS: 25.0000 mg | ORAL_TABLET | Freq: Four times a day (QID) | ORAL | 0 refills | Status: DC | PRN

## 2020-11-25 MED ORDER — DEXAMETHASONE SODIUM PHOSPHATE 10 MG/ML IJ SOLN
10.0000 mg | Freq: Once | INTRAMUSCULAR | Status: AC
  Administered 2020-11-25: 10 mg via INTRAMUSCULAR

## 2020-11-25 MED ORDER — DEXAMETHASONE SODIUM PHOSPHATE 10 MG/ML IJ SOLN
INTRAMUSCULAR | Status: AC
  Filled 2020-11-25: qty 1

## 2020-11-25 NOTE — Discharge Instructions (Signed)
Meds ordered this encounter  Medications   hydrOXYzine (ATARAX/VISTARIL) 25 MG tablet    Sig: Take 1 tablet (25 mg total) by mouth every 6 (six) hours as needed for itching.    Dispense:  20 tablet    Refill:  0   dexamethasone (DECADRON) injection 10 mg

## 2020-11-25 NOTE — ED Triage Notes (Signed)
Pt reports swelling, redness and pain the eyes since yesterday. States this episodes happen around this time of the year. Denies trouble breathing or swallowing, tongues heaviness.

## 2020-11-26 NOTE — ED Provider Notes (Signed)
[place]   [id number] 11/25/20 Arrival Time: 4098  ASSESSMENT & PLAN:  1. Allergic reaction, initial encounter   2. Facial swelling    Begin: Meds ordered this encounter  Medications  . hydrOXYzine (ATARAX/VISTARIL) 25 MG tablet    Sig: Take 1 tablet (25 mg total) by mouth every 6 (six) hours as needed for itching.    Dispense:  20 tablet    Refill:  0  . dexamethasone (DECADRON) injection 10 mg   No s/s of anaphylaxis.    Follow-up Information    Schedule an appointment as soon as possible for a visit  with Asthma, [facility]   Specialty: Allergy and Immunology Contact information: [address] [place] [address] [phone]        [facility]   Specialty: Urgent Care Why: If worsening or failing to improve as anticipated. Contact information: [place] [phone]              Reviewed expectations re: course of current medical issues. Questions answered. Outlined signs and symptoms indicating need for more acute intervention. Patient verbalized understanding. After Visit Summary given.   SUBJECTIVE: History from: patient. Alice Dominguez is a 45 y.o. female who presents for evaluation of a possible allergic reaction. Possible trigger: have not been identified. But h/o similar; yearly. Reports itchy and painful redness around eyes. Onset abrupt, first noted yesterday. Clinical course: unchanged. Respiratory symptoms: none. Denies chest pain, shortness of breath and wheezing. Care prior to arrival consisted of allergy medication, with minimal relief.  ROS: As per HPI.   OBJECTIVE:  Vitals:   11/25/20 1315  BP: 137/82  Pulse: 82  Resp: 18  Temp: 98 F (36.7 C)  TempSrc: Oral  SpO2: 98%    General appearance: alert; no distress Eyes: PERRLA; EOMI; conjunctiva normal HENT: normocephalic; atraumatic; nasal mucosa normal; oral mucosa  normal Neck: supple  Lungs: unlabored Extremities: no cyanosis or edema; symmetrical with no gross deformities Skin: warm and dry; redness and swelling periorbitally Neurologic: normal gait; normal symmetric reflexes Psychological: alert and cooperative; normal mood and affect    Allergies  Allergen Reactions  . Penicillins Rash    Past Medical History:  Diagnosis Date  . Asthma    Social History   Socioeconomic History  . Marital status: Married    Spouse name: Not on file  . Number of children: Not on file  . Years of education: Not on file  . Highest education level: Not on file  Occupational History  . Not on file  Tobacco Use  . Smoking status: Never Smoker  . Smokeless tobacco: Never Used  Substance and Sexual Activity  . Alcohol use: Never  . Drug use: Never  . Sexual activity: Not on file  Other Topics Concern  . Not on file  Social History Narrative  . Not on file   Social Determinants of Health   Financial Resource Strain: Not on file  Food Insecurity: Not on file  Transportation Needs: Not on file  Physical Activity: Not on file  Stress: Not on file  Social Connections: Not on file  Intimate Partner Violence: Not on file   Family History  Problem Relation Age of Onset  . Healthy Mother   . Healthy Father    Past Surgical History:  Procedure Laterality Date  . DILATION AND EVACUATION     miscarriage  . TONSILLECTOMY  Vanessa Kick, MD 11/26/20 [id number]

## 2020-12-04 ENCOUNTER — Ambulatory Visit (HOSPITAL_COMMUNITY)
Admission: EM | Admit: 2020-12-04 | Discharge: 2020-12-04 | Disposition: A | Discharge status: Home / Self Care | Payer: BC Managed Care – PPO | Attending: Family Medicine | Admitting: Family Medicine

## 2020-12-04 ENCOUNTER — Other Ambulatory Visit: Payer: Self-pay

## 2020-12-04 ENCOUNTER — Encounter (HOSPITAL_COMMUNITY): Payer: Self-pay

## 2020-12-04 DIAGNOSIS — H01132 Eczematous dermatitis of right lower eyelid: Secondary | ICD-10-CM | POA: Diagnosis not present

## 2020-12-04 DIAGNOSIS — H01135 Eczematous dermatitis of left lower eyelid: Secondary | ICD-10-CM | POA: Diagnosis not present

## 2020-12-04 DIAGNOSIS — H01131 Eczematous dermatitis of right upper eyelid: Secondary | ICD-10-CM | POA: Diagnosis not present

## 2020-12-04 DIAGNOSIS — H01134 Eczematous dermatitis of left upper eyelid: Secondary | ICD-10-CM | POA: Diagnosis not present

## 2020-12-04 MED ORDER — METHYLPREDNISOLONE SODIUM SUCC 125 MG IJ SOLR
INTRAMUSCULAR | Status: AC
  Filled 2020-12-04: qty 2

## 2020-12-04 MED ORDER — EUCRISA 2 % EX OINT: 1.0000 "application " | TOPICAL_OINTMENT | Freq: Two times a day (BID) | CUTANEOUS | 2 refills | Status: DC | PRN

## 2020-12-04 MED ORDER — METHYLPREDNISOLONE SODIUM SUCC 125 MG IJ SOLR
60.0000 mg | Freq: Once | INTRAMUSCULAR | Status: AC
  Administered 2020-12-04: 60 mg via INTRAMUSCULAR

## 2020-12-04 NOTE — ED Provider Notes (Signed)
[facility]    CSN: [id number] Arrival date & time: 12/04/20  1056      History   Chief Complaint Chief Complaint  Patient presents with  . Allergic Reaction    HPI Alice Dominguez is a 45 y.o. female.   Patient here today with acute on chronic rotation, redness, itching, swelling to bilateral upper and lower eyelids.  She says happens fairly frequently to her and she uses moisturizing cream for it with mild relief but symptoms are much worse than usual at the moment.  No new exposures/products, fever, chills, globe irritation, visual changes, headaches.  States history of eczema.     Past Medical History:  Diagnosis Date  . Asthma     There are no problems to display for this patient.   Past Surgical History:  Procedure Laterality Date  . DILATION AND EVACUATION     miscarriage  . TONSILLECTOMY      OB History   No obstetric history on file.      Home Medications    Prior to Admission medications   Medication Sig Start Date End Date Taking? Authorizing Provider  Crisaborole (EUCRISA) 2 % OINT Apply 1 application topically 2 (two) times daily as needed. 12/04/20  Yes Volney American, PA-C  budesonide (PULMICORT [place]) 180 MCG/ACT inhaler 1 puff 02/06/18   [provider]  hydrOXYzine (ATARAX/VISTARIL) 25 MG tablet Take 1 tablet (25 mg total) by mouth every 6 (six) hours as needed for itching. 11/25/20   Vanessa Kick, MD  montelukast (SINGULAIR) 10 MG tablet Take 1 tablet (10 mg total) by mouth at bedtime. 12/16/19 01/15/20  Darr, Edison Nasuti, PA-C    Family History Family History  Problem Relation Age of Onset  . Healthy Mother   . Healthy Father     Social History Social History   Tobacco Use  . Smoking status: Never Smoker  . Smokeless tobacco: Never Used  Substance Use Topics  . Alcohol use: Never  . Drug use: Never     Allergies   Other and Penicillins   Review of Systems Review of Systems Per HPI Physical  Exam Triage Vital Signs ED Triage Vitals  Enc Vitals Group     BP 12/04/20 1130 129/66     Pulse Rate 12/04/20 1130 76     Resp 12/04/20 1130 18     Temp 12/04/20 1130 97.6 F (36.4 C)     Temp src --      SpO2 12/04/20 1130 99 %     Weight --      Height --      Head Circumference --      Peak Flow --      Pain Score 12/04/20 1128 7     Pain Loc --      Pain Edu? --      Excl. in [place]? --    No data found.  Updated Vital Signs BP 129/66   Pulse 76   Temp 97.6 F (36.4 C)   Resp 18   LMP 11/16/2020 (Approximate)   SpO2 99%   Visual Acuity Right Eye Distance:   Left Eye Distance:   Bilateral Distance:    Right Eye Near:   Left Eye Near:    Bilateral Near:     Physical Exam Vitals and nursing note reviewed.  Constitutional:      Appearance: Normal appearance. She is not ill-appearing.  HENT:     Head: Atraumatic.  Eyes:  Extraocular Movements: Extraocular movements intact.     Conjunctiva/sclera: Conjunctivae normal.  Cardiovascular:     Rate and Rhythm: Normal rate and regular rhythm.     Heart sounds: Normal heart sounds.  Pulmonary:     Effort: Pulmonary effort is normal.     Breath sounds: Normal breath sounds.  Musculoskeletal:        General: Normal range of motion.     Cervical back: Normal range of motion and neck supple.  Skin:    General: Skin is warm and dry.     Comments: Eyelid dermatitis present bilateral upper and lower eyelids and surrounding area  Neurological:     Mental Status: She is alert and oriented to person, place, and time.  Psychiatric:        Mood and Affect: Mood normal.        Thought Content: Thought content normal.        Judgment: Judgment normal.      UC Treatments / Results  Labs (all labs ordered are listed, but only abnormal results are displayed) Labs Reviewed - No data to display  EKG   Radiology No results found.  Procedures Procedures (including critical care time)  Medications Ordered in  UC Medications  methylPREDNISolone sodium succinate (SOLU-MEDROL) 125 mg/2 mL injection 60 mg (60 mg Intramuscular Given 12/04/20 1200)    Initial Impression / Assessment and Plan / UC Course  I have reviewed the triage vital signs and the nursing notes.  Pertinent labs & imaging results that were available during my care of the patient were reviewed by me and considered in my medical decision making (see chart for details).     Consistent with eczema to eyelids, will send [place] and discussed if not covered by insurance that she can either find a co-pay card from the drug website or try good Rx.  Continue hypoallergenic moisturizers and avoidance of make-up or other irritating products.  Follow-up with dermatology for recheck.  Final Clinical Impressions(s) / UC Diagnoses   Final diagnoses:  Eczematous dermatitis of upper and lower eyelids of both eyes   Discharge Instructions   None    ED Prescriptions    Medication Sig Dispense Auth. Provider   Crisaborole (EUCRISA) 2 % OINT Apply 1 application topically 2 (two) times daily as needed. 100 g Volney American, [place]     PDMP not reviewed this encounter.   Volney American, [place] 12/04/20 1623

## 2020-12-04 NOTE — ED Triage Notes (Signed)
Pt in with c/o allergic reaction that she noticed yesterday and she woke up today with swelling around both eyes

## 2020-12-23 ENCOUNTER — Encounter (HOSPITAL_COMMUNITY): Payer: Self-pay | Admitting: Emergency Medicine

## 2020-12-23 ENCOUNTER — Ambulatory Visit (HOSPITAL_COMMUNITY)
Admission: EM | Admit: 2020-12-23 | Discharge: 2020-12-23 | Disposition: A | Discharge status: Home / Self Care | Payer: BC Managed Care – PPO | Attending: Internal Medicine | Admitting: Internal Medicine

## 2020-12-23 ENCOUNTER — Other Ambulatory Visit: Payer: Self-pay

## 2020-12-23 DIAGNOSIS — H01139 Eczematous dermatitis of unspecified eye, unspecified eyelid: Secondary | ICD-10-CM

## 2020-12-23 DIAGNOSIS — H01133 Eczematous dermatitis of right eye, unspecified eyelid: Secondary | ICD-10-CM | POA: Diagnosis not present

## 2020-12-23 DIAGNOSIS — J302 Other seasonal allergic rhinitis: Secondary | ICD-10-CM

## 2020-12-23 DIAGNOSIS — H01136 Eczematous dermatitis of left eye, unspecified eyelid: Secondary | ICD-10-CM

## 2020-12-23 DIAGNOSIS — H1013 Acute atopic conjunctivitis, bilateral: Secondary | ICD-10-CM

## 2020-12-23 HISTORY — DX: Other seasonal allergic rhinitis: J30.2

## 2020-12-23 MED ORDER — PREDNISONE 20 MG PO TABS: 20.0000 mg | ORAL_TABLET | Freq: Every day | ORAL | 0 refills | Status: AC

## 2020-12-23 MED ORDER — AZELASTINE HCL 0.05 % OP SOLN: 1.0000 [drp] | Freq: Two times a day (BID) | OPHTHALMIC | 0 refills | Status: DC

## 2020-12-23 MED ORDER — HYDROCORTISONE 0.5 % EX CREA: 1.0000 "application " | TOPICAL_CREAM | Freq: Two times a day (BID) | CUTANEOUS | 0 refills | Status: AC

## 2020-12-23 NOTE — ED Provider Notes (Signed)
[facility]    CSN: [id number] Arrival date & time: 12/23/20  1425      History   Chief Complaint Chief Complaint  Patient presents with  . eye irritation (bilateral)    HPI Alice Dominguez is a 45 y.o. female.   Alice Dominguez presents today with a several day history of worsening redness and irritation of bilateral eye and eyelids. She denies any purulent drainage.  She was seen for similar symptoms on 12/04/2020 and was prescribed a steroid cream as well as given solumedrol. She had improvement with this regimen until symptoms worsened a few days ago. She denies any cough, congestion, fever, ocular pain, vision change. She has tried numerous medications including OTC antihistamines, pataday, Singulair without improvement of symptoms. She has had similar symptoms since she was younger that occur every fall and spring and states that current symptoms are similar to previous episodes of this condition. She has not seen an allergist in the past but is scheduled to see one in a few weeks.      Past Medical History:  Diagnosis Date  . Asthma   . Seasonal allergies     There are no problems to display for this patient.   Past Surgical History:  Procedure Laterality Date  . DILATION AND EVACUATION     miscarriage  . TONSILLECTOMY      OB History   No obstetric history on file.      Home Medications    Prior to Admission medications   Medication Sig Start Date End Date Taking? Authorizing Provider  azelastine (OPTIVAR) 0.05 % ophthalmic solution Place 1 drop into both eyes 2 (two) times daily. 12/23/20  Yes Altovise Wahler K, PA-C  budesonide (PULMICORT FLEXHALER) 180 MCG/ACT inhaler 1 puff 02/06/18  Yes [provider]  Crisaborole (EUCRISA) 2 % OINT Apply 1 application topically 2 (two) times daily as needed. 12/04/20  Yes Volney American, PA-C  hydrocortisone cream 0.5 % Apply 1 application topically 2 (two) times daily. 12/23/20  Yes Luellen Howson,  Isel Skufca K, PA-C  montelukast (SINGULAIR) 10 MG tablet Take 1 tablet (10 mg total) by mouth at bedtime. 12/16/19 01/15/20 Yes Darr, Edison Nasuti, PA-C  predniSONE (DELTASONE) 20 MG tablet Take 1 tablet (20 mg total) by mouth daily for 4 days. 12/23/20 12/27/20 Yes Zaydrian Batta K, PA-C  hydrOXYzine (ATARAX/VISTARIL) 25 MG tablet Take 1 tablet (25 mg total) by mouth every 6 (six) hours as needed for itching. 11/25/20   Vanessa Kick, MD    Family History Family History  Problem Relation Age of Onset  . Healthy Mother   . Healthy Father     Social History Social History   Tobacco Use  . Smoking status: Never Smoker  . Smokeless tobacco: Never Used  Vaping Use  . Vaping Use: Never used  Substance Use Topics  . Alcohol use: Never  . Drug use: Never     Allergies   Barley green [barley grass], Cat hair extract, Grass pollen([id number] vern), Other, Molds & smuts, and Penicillins   Review of Systems Review of Systems  Constitutional: Negative for activity change and appetite change.  HENT: Negative for congestion, sinus pressure, sneezing and sore throat.   Eyes: Positive for discharge (clear), redness and itching. Negative for photophobia, pain and visual disturbance.  Respiratory: Negative for cough and shortness of breath.   Gastrointestinal: Negative for abdominal pain, diarrhea, nausea and vomiting.  Neurological: Negative for dizziness, light-headedness and headaches.     Physical Exam Triage  Vital Signs ED Triage Vitals  Enc Vitals Group     BP 12/23/20 1445 126/82     Pulse Rate 12/23/20 1445 91     Resp --      Temp 12/23/20 1445 98.2 F (36.8 C)     Temp Source 12/23/20 1445 Oral     SpO2 12/23/20 1445 98 %     Weight --      Height 12/23/20 1439 5' 6.93" (1.7 m)     Head Circumference --      Peak Flow --      Pain Score 12/23/20 1439 4     Pain Loc --      Pain Edu? --      Excl. in [place]? --    No data found.  Updated Vital Signs BP 126/82 (BP Location: Left Arm)    Pulse 91   Temp 98.2 F (36.8 C) (Oral)   Ht 5' 6.93" (1.7 m)   LMP 12/09/2020 (Approximate)   SpO2 98%   BMI 27.68 kg/m   Visual Acuity Right Eye Distance: 20/30 Left Eye Distance: 20/30 Bilateral Distance: 20/30  Right Eye Near:   Left Eye Near:    Bilateral Near:     Physical Exam Vitals reviewed.  Constitutional:      General: She is awake. She is not in acute distress.    Appearance: Normal appearance. She is not ill-appearing.     Comments: Very pleasant female appears stated age in no acute distress.  HENT:     Head: Normocephalic and atraumatic.     Nose: Nose normal.     Mouth/Throat:     Pharynx: Uvula midline. No oropharyngeal exudate or posterior oropharyngeal erythema.  Eyes:     Extraocular Movements: Extraocular movements intact.     Conjunctiva/sclera:     Right eye: Right conjunctiva is injected.     Left eye: Left conjunctiva is injected.     Pupils: Pupils are equal, round, and reactive to light.  Cardiovascular:     Rate and Rhythm: Normal rate and regular rhythm.     Heart sounds: No murmur heard.   Pulmonary:     Effort: Pulmonary effort is normal.     Breath sounds: Normal breath sounds. No wheezing, rhonchi or rales.  Musculoskeletal:     Right lower leg: No edema.     Left lower leg: No edema.  Skin:    General: Skin is warm.     Comments: Erythematous rash noted bilaterally upper and lower eyelids  Psychiatric:        Behavior: Behavior is cooperative.      UC Treatments / Results  Labs (all labs ordered are listed, but only abnormal results are displayed) Labs Reviewed - No data to display  EKG   Radiology No results found.  Procedures Procedures (including critical care time)  Medications Ordered in UC Medications - No data to display  Initial Impression / Assessment and Plan / UC Course  I have reviewed the triage vital signs and the nursing notes.  Pertinent labs & imaging results that were available during my  care of the patient were reviewed by me and considered in my medical decision making (see chart for details).     Patient given 0.5% hydrocortisone cream for rash which is consistent with eczema. We discussed that there are limited options for steroid cream given location of rash. She has done well with steroids in the past and was given prednisone burst of  20 mg x4 days with instruction not to take NSAIDs with this medication. She was also prescribed antihistamine eye drop to help with additional symptoms. Recommended she follow up allergist as scheduled. Strict return precautions given to which patient expressed understanding.   Final Clinical Impressions(s) / UC Diagnoses   Final diagnoses:  Allergic conjunctivitis of both eyes  Eczema of eyelid, unspecified laterality  Seasonal allergies     Discharge Instructions     No NSAIDs (ibuprofen, naproxen, aspirin) with prednisone. Use hypoallergenic products. Follow up with allergist.     ED Prescriptions    Medication Sig Dispense Auth. Provider   predniSONE (DELTASONE) 20 MG tablet Take 1 tablet (20 mg total) by mouth daily for 4 days. 4 tablet Nickola Lenig K, PA-C   hydrocortisone cream 0.5 % Apply 1 application topically 2 (two) times daily. 30 g Geneva Pallas K, PA-C   azelastine (OPTIVAR) 0.05 % ophthalmic solution Place 1 drop into both eyes 2 (two) times daily. 6 mL Aleksei Goodlin K, PA-C     PDMP not reviewed this encounter.   Terrilee Croak, PA-C 12/23/20 1527

## 2020-12-23 NOTE — ED Triage Notes (Signed)
Pt presents today with bilateral eye irritation that she reports is from allergies x 3 days.

## 2020-12-23 NOTE — Discharge Instructions (Addendum)
No NSAIDs (ibuprofen, naproxen, aspirin) with prednisone. Use hypoallergenic products. Follow up with allergist.

## 2020-12-30 DIAGNOSIS — H1045 Other chronic allergic conjunctivitis: Secondary | ICD-10-CM | POA: Diagnosis not present

## 2020-12-30 DIAGNOSIS — J3089 Other allergic rhinitis: Secondary | ICD-10-CM | POA: Diagnosis not present

## 2020-12-30 DIAGNOSIS — J3081 Allergic rhinitis due to animal (cat) (dog) hair and dander: Secondary | ICD-10-CM | POA: Diagnosis not present

## 2020-12-30 DIAGNOSIS — J301 Allergic rhinitis due to pollen: Secondary | ICD-10-CM | POA: Diagnosis not present

## 2021-01-29 ENCOUNTER — Ambulatory Visit
Admission: RE | Admit: 2021-01-29 | Discharge: 2021-01-29 | Disposition: A | Discharge status: Home / Self Care | Payer: BC Managed Care – PPO | Source: Ambulatory Visit | Attending: Family Medicine | Admitting: Family Medicine

## 2021-01-29 ENCOUNTER — Other Ambulatory Visit: Payer: Self-pay

## 2021-01-29 ENCOUNTER — Other Ambulatory Visit: Payer: Self-pay | Admitting: Family Medicine

## 2021-01-29 DIAGNOSIS — Z1231 Encounter for screening mammogram for malignant neoplasm of breast: Secondary | ICD-10-CM

## 2021-02-08 DIAGNOSIS — J301 Allergic rhinitis due to pollen: Secondary | ICD-10-CM | POA: Diagnosis not present

## 2021-02-09 DIAGNOSIS — J3081 Allergic rhinitis due to animal (cat) (dog) hair and dander: Secondary | ICD-10-CM | POA: Diagnosis not present

## 2021-02-09 DIAGNOSIS — J3089 Other allergic rhinitis: Secondary | ICD-10-CM | POA: Diagnosis not present

## 2021-03-04 DIAGNOSIS — Z01419 Encounter for gynecological examination (general) (routine) without abnormal findings: Secondary | ICD-10-CM | POA: Diagnosis not present

## 2021-03-04 DIAGNOSIS — Z6827 Body mass index (BMI) 27.0-27.9, adult: Secondary | ICD-10-CM | POA: Diagnosis not present

## 2021-05-05 DIAGNOSIS — H1045 Other chronic allergic conjunctivitis: Secondary | ICD-10-CM | POA: Diagnosis not present

## 2021-05-05 DIAGNOSIS — J3089 Other allergic rhinitis: Secondary | ICD-10-CM | POA: Diagnosis not present

## 2021-05-05 DIAGNOSIS — J3081 Allergic rhinitis due to animal (cat) (dog) hair and dander: Secondary | ICD-10-CM | POA: Diagnosis not present

## 2021-05-05 DIAGNOSIS — J4530 Mild persistent asthma, uncomplicated: Secondary | ICD-10-CM | POA: Diagnosis not present

## 2021-05-05 DIAGNOSIS — Z91018 Allergy to other foods: Secondary | ICD-10-CM | POA: Diagnosis not present

## 2021-05-05 DIAGNOSIS — J301 Allergic rhinitis due to pollen: Secondary | ICD-10-CM | POA: Diagnosis not present

## 2021-05-05 DIAGNOSIS — R21 Rash and other nonspecific skin eruption: Secondary | ICD-10-CM | POA: Diagnosis not present

## 2021-05-07 DIAGNOSIS — J3081 Allergic rhinitis due to animal (cat) (dog) hair and dander: Secondary | ICD-10-CM | POA: Diagnosis not present

## 2021-05-07 DIAGNOSIS — J3089 Other allergic rhinitis: Secondary | ICD-10-CM | POA: Diagnosis not present

## 2021-05-07 DIAGNOSIS — J301 Allergic rhinitis due to pollen: Secondary | ICD-10-CM | POA: Diagnosis not present

## 2021-05-10 DIAGNOSIS — J3089 Other allergic rhinitis: Secondary | ICD-10-CM | POA: Diagnosis not present

## 2021-05-10 DIAGNOSIS — J301 Allergic rhinitis due to pollen: Secondary | ICD-10-CM | POA: Diagnosis not present

## 2021-05-10 DIAGNOSIS — J3081 Allergic rhinitis due to animal (cat) (dog) hair and dander: Secondary | ICD-10-CM | POA: Diagnosis not present

## 2021-05-12 DIAGNOSIS — J3081 Allergic rhinitis due to animal (cat) (dog) hair and dander: Secondary | ICD-10-CM | POA: Diagnosis not present

## 2021-05-12 DIAGNOSIS — J301 Allergic rhinitis due to pollen: Secondary | ICD-10-CM | POA: Diagnosis not present

## 2021-05-12 DIAGNOSIS — J3089 Other allergic rhinitis: Secondary | ICD-10-CM | POA: Diagnosis not present

## 2021-05-25 DIAGNOSIS — J4530 Mild persistent asthma, uncomplicated: Secondary | ICD-10-CM | POA: Diagnosis not present

## 2021-05-25 DIAGNOSIS — H1045 Other chronic allergic conjunctivitis: Secondary | ICD-10-CM | POA: Diagnosis not present

## 2021-05-25 DIAGNOSIS — R21 Rash and other nonspecific skin eruption: Secondary | ICD-10-CM | POA: Diagnosis not present

## 2021-05-25 DIAGNOSIS — Z91018 Allergy to other foods: Secondary | ICD-10-CM | POA: Diagnosis not present

## 2021-05-27 DIAGNOSIS — Z9109 Other allergy status, other than to drugs and biological substances: Secondary | ICD-10-CM | POA: Diagnosis not present

## 2021-05-27 DIAGNOSIS — R200 Anesthesia of skin: Secondary | ICD-10-CM | POA: Diagnosis not present

## 2021-05-27 DIAGNOSIS — L608 Other nail disorders: Secondary | ICD-10-CM | POA: Diagnosis not present

## 2021-05-27 DIAGNOSIS — L309 Dermatitis, unspecified: Secondary | ICD-10-CM | POA: Diagnosis not present

## 2021-09-13 DIAGNOSIS — L309 Dermatitis, unspecified: Secondary | ICD-10-CM | POA: Diagnosis not present

## 2021-09-13 DIAGNOSIS — R229 Localized swelling, mass and lump, unspecified: Secondary | ICD-10-CM | POA: Diagnosis not present

## 2021-09-13 DIAGNOSIS — J45909 Unspecified asthma, uncomplicated: Secondary | ICD-10-CM | POA: Diagnosis not present

## 2021-09-13 DIAGNOSIS — G5600 Carpal tunnel syndrome, unspecified upper limb: Secondary | ICD-10-CM | POA: Diagnosis not present

## 2021-09-23 ENCOUNTER — Other Ambulatory Visit: Payer: Self-pay | Admitting: Home Modifications

## 2021-09-23 DIAGNOSIS — R229 Localized swelling, mass and lump, unspecified: Secondary | ICD-10-CM

## 2021-10-05 ENCOUNTER — Ambulatory Visit
Admission: RE | Admit: 2021-10-05 | Discharge: 2021-10-05 | Disposition: A | Discharge status: Home / Self Care | Payer: BC Managed Care – PPO | Source: Ambulatory Visit | Attending: Home Modifications | Admitting: Home Modifications

## 2021-10-05 DIAGNOSIS — R220 Localized swelling, mass and lump, head: Secondary | ICD-10-CM | POA: Diagnosis not present

## 2021-10-05 DIAGNOSIS — R229 Localized swelling, mass and lump, unspecified: Secondary | ICD-10-CM

## 2021-10-06 DIAGNOSIS — G5603 Carpal tunnel syndrome, bilateral upper limbs: Secondary | ICD-10-CM | POA: Diagnosis not present

## 2021-10-06 DIAGNOSIS — M6281 Muscle weakness (generalized): Secondary | ICD-10-CM | POA: Diagnosis not present

## 2021-10-06 DIAGNOSIS — R202 Paresthesia of skin: Secondary | ICD-10-CM | POA: Diagnosis not present

## 2021-10-13 DIAGNOSIS — L309 Dermatitis, unspecified: Secondary | ICD-10-CM | POA: Diagnosis not present

## 2021-10-27 DIAGNOSIS — L239 Allergic contact dermatitis, unspecified cause: Secondary | ICD-10-CM | POA: Diagnosis not present

## 2021-11-05 DIAGNOSIS — J3089 Other allergic rhinitis: Secondary | ICD-10-CM | POA: Diagnosis not present

## 2021-11-05 DIAGNOSIS — J4530 Mild persistent asthma, uncomplicated: Secondary | ICD-10-CM | POA: Diagnosis not present

## 2021-11-05 DIAGNOSIS — H1045 Other chronic allergic conjunctivitis: Secondary | ICD-10-CM | POA: Diagnosis not present

## 2021-11-05 DIAGNOSIS — R21 Rash and other nonspecific skin eruption: Secondary | ICD-10-CM | POA: Diagnosis not present

## 2021-11-10 DIAGNOSIS — H16043 Marginal corneal ulcer, bilateral: Secondary | ICD-10-CM | POA: Diagnosis not present

## 2021-11-10 DIAGNOSIS — H5213 Myopia, bilateral: Secondary | ICD-10-CM | POA: Diagnosis not present

## 2021-11-17 DIAGNOSIS — J301 Allergic rhinitis due to pollen: Secondary | ICD-10-CM | POA: Diagnosis not present

## 2021-11-18 DIAGNOSIS — J3081 Allergic rhinitis due to animal (cat) (dog) hair and dander: Secondary | ICD-10-CM | POA: Diagnosis not present

## 2021-11-18 DIAGNOSIS — J3089 Other allergic rhinitis: Secondary | ICD-10-CM | POA: Diagnosis not present

## 2021-11-19 DIAGNOSIS — J3089 Other allergic rhinitis: Secondary | ICD-10-CM | POA: Diagnosis not present

## 2021-11-19 DIAGNOSIS — J301 Allergic rhinitis due to pollen: Secondary | ICD-10-CM | POA: Diagnosis not present

## 2021-11-19 DIAGNOSIS — J3081 Allergic rhinitis due to animal (cat) (dog) hair and dander: Secondary | ICD-10-CM | POA: Diagnosis not present

## 2021-11-25 DIAGNOSIS — L2089 Other atopic dermatitis: Secondary | ICD-10-CM | POA: Diagnosis not present

## 2021-11-25 DIAGNOSIS — L400 Psoriasis vulgaris: Secondary | ICD-10-CM | POA: Diagnosis not present

## 2021-12-30 ENCOUNTER — Other Ambulatory Visit: Payer: Self-pay | Admitting: Home Modifications

## 2021-12-30 DIAGNOSIS — Z1231 Encounter for screening mammogram for malignant neoplasm of breast: Secondary | ICD-10-CM

## 2022-01-11 DIAGNOSIS — B351 Tinea unguium: Secondary | ICD-10-CM | POA: Diagnosis not present

## 2022-01-11 DIAGNOSIS — G5600 Carpal tunnel syndrome, unspecified upper limb: Secondary | ICD-10-CM | POA: Diagnosis not present

## 2022-01-11 DIAGNOSIS — L209 Atopic dermatitis, unspecified: Secondary | ICD-10-CM | POA: Diagnosis not present

## 2022-01-11 DIAGNOSIS — D170 Benign lipomatous neoplasm of skin and subcutaneous tissue of head, face and neck: Secondary | ICD-10-CM | POA: Diagnosis not present

## 2022-01-12 DIAGNOSIS — J3081 Allergic rhinitis due to animal (cat) (dog) hair and dander: Secondary | ICD-10-CM | POA: Diagnosis not present

## 2022-01-12 DIAGNOSIS — M722 Plantar fascial fibromatosis: Secondary | ICD-10-CM | POA: Diagnosis not present

## 2022-01-12 DIAGNOSIS — J301 Allergic rhinitis due to pollen: Secondary | ICD-10-CM | POA: Diagnosis not present

## 2022-01-12 DIAGNOSIS — G5603 Carpal tunnel syndrome, bilateral upper limbs: Secondary | ICD-10-CM | POA: Diagnosis not present

## 2022-01-12 DIAGNOSIS — J3089 Other allergic rhinitis: Secondary | ICD-10-CM | POA: Diagnosis not present

## 2022-01-14 DIAGNOSIS — J3081 Allergic rhinitis due to animal (cat) (dog) hair and dander: Secondary | ICD-10-CM | POA: Diagnosis not present

## 2022-01-14 DIAGNOSIS — J301 Allergic rhinitis due to pollen: Secondary | ICD-10-CM | POA: Diagnosis not present

## 2022-01-14 DIAGNOSIS — J3089 Other allergic rhinitis: Secondary | ICD-10-CM | POA: Diagnosis not present

## 2022-01-17 DIAGNOSIS — J3089 Other allergic rhinitis: Secondary | ICD-10-CM | POA: Diagnosis not present

## 2022-01-17 DIAGNOSIS — J3081 Allergic rhinitis due to animal (cat) (dog) hair and dander: Secondary | ICD-10-CM | POA: Diagnosis not present

## 2022-01-17 DIAGNOSIS — J301 Allergic rhinitis due to pollen: Secondary | ICD-10-CM | POA: Diagnosis not present

## 2022-01-19 DIAGNOSIS — J301 Allergic rhinitis due to pollen: Secondary | ICD-10-CM | POA: Diagnosis not present

## 2022-01-19 DIAGNOSIS — J3089 Other allergic rhinitis: Secondary | ICD-10-CM | POA: Diagnosis not present

## 2022-01-19 DIAGNOSIS — J3081 Allergic rhinitis due to animal (cat) (dog) hair and dander: Secondary | ICD-10-CM | POA: Diagnosis not present

## 2022-01-21 DIAGNOSIS — J3081 Allergic rhinitis due to animal (cat) (dog) hair and dander: Secondary | ICD-10-CM | POA: Diagnosis not present

## 2022-01-21 DIAGNOSIS — J301 Allergic rhinitis due to pollen: Secondary | ICD-10-CM | POA: Diagnosis not present

## 2022-01-21 DIAGNOSIS — J3089 Other allergic rhinitis: Secondary | ICD-10-CM | POA: Diagnosis not present

## 2022-01-24 DIAGNOSIS — J301 Allergic rhinitis due to pollen: Secondary | ICD-10-CM | POA: Diagnosis not present

## 2022-01-24 DIAGNOSIS — J3081 Allergic rhinitis due to animal (cat) (dog) hair and dander: Secondary | ICD-10-CM | POA: Diagnosis not present

## 2022-01-24 DIAGNOSIS — J3089 Other allergic rhinitis: Secondary | ICD-10-CM | POA: Diagnosis not present

## 2022-01-26 DIAGNOSIS — J3081 Allergic rhinitis due to animal (cat) (dog) hair and dander: Secondary | ICD-10-CM | POA: Diagnosis not present

## 2022-01-26 DIAGNOSIS — J3089 Other allergic rhinitis: Secondary | ICD-10-CM | POA: Diagnosis not present

## 2022-01-26 DIAGNOSIS — J301 Allergic rhinitis due to pollen: Secondary | ICD-10-CM | POA: Diagnosis not present

## 2022-01-28 DIAGNOSIS — J3081 Allergic rhinitis due to animal (cat) (dog) hair and dander: Secondary | ICD-10-CM | POA: Diagnosis not present

## 2022-01-28 DIAGNOSIS — J3089 Other allergic rhinitis: Secondary | ICD-10-CM | POA: Diagnosis not present

## 2022-01-28 DIAGNOSIS — J301 Allergic rhinitis due to pollen: Secondary | ICD-10-CM | POA: Diagnosis not present

## 2022-01-31 ENCOUNTER — Ambulatory Visit: Payer: Self-pay

## 2022-01-31 DIAGNOSIS — J3089 Other allergic rhinitis: Secondary | ICD-10-CM | POA: Diagnosis not present

## 2022-01-31 DIAGNOSIS — J301 Allergic rhinitis due to pollen: Secondary | ICD-10-CM | POA: Diagnosis not present

## 2022-01-31 DIAGNOSIS — J3081 Allergic rhinitis due to animal (cat) (dog) hair and dander: Secondary | ICD-10-CM | POA: Diagnosis not present

## 2022-02-02 ENCOUNTER — Ambulatory Visit
Admission: RE | Admit: 2022-02-02 | Discharge: 2022-02-02 | Disposition: A | Discharge status: Home / Self Care | Payer: BC Managed Care – PPO | Source: Ambulatory Visit | Attending: Home Modifications | Admitting: Home Modifications

## 2022-02-02 DIAGNOSIS — Z1231 Encounter for screening mammogram for malignant neoplasm of breast: Secondary | ICD-10-CM

## 2022-02-02 DIAGNOSIS — J301 Allergic rhinitis due to pollen: Secondary | ICD-10-CM | POA: Diagnosis not present

## 2022-02-02 DIAGNOSIS — J3089 Other allergic rhinitis: Secondary | ICD-10-CM | POA: Diagnosis not present

## 2022-02-02 DIAGNOSIS — J3081 Allergic rhinitis due to animal (cat) (dog) hair and dander: Secondary | ICD-10-CM | POA: Diagnosis not present

## 2022-02-03 ENCOUNTER — Encounter (HOSPITAL_COMMUNITY): Payer: Self-pay | Admitting: Emergency Medicine

## 2022-02-04 DIAGNOSIS — J3089 Other allergic rhinitis: Secondary | ICD-10-CM | POA: Diagnosis not present

## 2022-02-04 DIAGNOSIS — D170 Benign lipomatous neoplasm of skin and subcutaneous tissue of head, face and neck: Secondary | ICD-10-CM | POA: Diagnosis not present

## 2022-02-04 DIAGNOSIS — J301 Allergic rhinitis due to pollen: Secondary | ICD-10-CM | POA: Diagnosis not present

## 2022-02-04 DIAGNOSIS — J3081 Allergic rhinitis due to animal (cat) (dog) hair and dander: Secondary | ICD-10-CM | POA: Diagnosis not present

## 2022-02-07 DIAGNOSIS — J301 Allergic rhinitis due to pollen: Secondary | ICD-10-CM | POA: Diagnosis not present

## 2022-02-07 DIAGNOSIS — J3089 Other allergic rhinitis: Secondary | ICD-10-CM | POA: Diagnosis not present

## 2022-02-07 DIAGNOSIS — J3081 Allergic rhinitis due to animal (cat) (dog) hair and dander: Secondary | ICD-10-CM | POA: Diagnosis not present

## 2022-02-10 DIAGNOSIS — J3081 Allergic rhinitis due to animal (cat) (dog) hair and dander: Secondary | ICD-10-CM | POA: Diagnosis not present

## 2022-02-10 DIAGNOSIS — J301 Allergic rhinitis due to pollen: Secondary | ICD-10-CM | POA: Diagnosis not present

## 2022-02-10 DIAGNOSIS — J3089 Other allergic rhinitis: Secondary | ICD-10-CM | POA: Diagnosis not present

## 2022-02-14 DIAGNOSIS — J3089 Other allergic rhinitis: Secondary | ICD-10-CM | POA: Diagnosis not present

## 2022-02-14 DIAGNOSIS — J3081 Allergic rhinitis due to animal (cat) (dog) hair and dander: Secondary | ICD-10-CM | POA: Diagnosis not present

## 2022-02-14 DIAGNOSIS — J301 Allergic rhinitis due to pollen: Secondary | ICD-10-CM | POA: Diagnosis not present

## 2022-02-16 DIAGNOSIS — J3089 Other allergic rhinitis: Secondary | ICD-10-CM | POA: Diagnosis not present

## 2022-02-16 DIAGNOSIS — J301 Allergic rhinitis due to pollen: Secondary | ICD-10-CM | POA: Diagnosis not present

## 2022-02-16 DIAGNOSIS — J3081 Allergic rhinitis due to animal (cat) (dog) hair and dander: Secondary | ICD-10-CM | POA: Diagnosis not present

## 2022-02-18 DIAGNOSIS — J301 Allergic rhinitis due to pollen: Secondary | ICD-10-CM | POA: Diagnosis not present

## 2022-02-18 DIAGNOSIS — J3089 Other allergic rhinitis: Secondary | ICD-10-CM | POA: Diagnosis not present

## 2022-02-18 DIAGNOSIS — J3081 Allergic rhinitis due to animal (cat) (dog) hair and dander: Secondary | ICD-10-CM | POA: Diagnosis not present

## 2022-02-22 DIAGNOSIS — J301 Allergic rhinitis due to pollen: Secondary | ICD-10-CM | POA: Diagnosis not present

## 2022-02-22 DIAGNOSIS — J3081 Allergic rhinitis due to animal (cat) (dog) hair and dander: Secondary | ICD-10-CM | POA: Diagnosis not present

## 2022-02-22 DIAGNOSIS — J3089 Other allergic rhinitis: Secondary | ICD-10-CM | POA: Diagnosis not present

## 2022-02-24 DIAGNOSIS — J3089 Other allergic rhinitis: Secondary | ICD-10-CM | POA: Diagnosis not present

## 2022-02-24 DIAGNOSIS — J3081 Allergic rhinitis due to animal (cat) (dog) hair and dander: Secondary | ICD-10-CM | POA: Diagnosis not present

## 2022-02-24 DIAGNOSIS — J301 Allergic rhinitis due to pollen: Secondary | ICD-10-CM | POA: Diagnosis not present

## 2022-02-28 DIAGNOSIS — J301 Allergic rhinitis due to pollen: Secondary | ICD-10-CM | POA: Diagnosis not present

## 2022-02-28 DIAGNOSIS — J3081 Allergic rhinitis due to animal (cat) (dog) hair and dander: Secondary | ICD-10-CM | POA: Diagnosis not present

## 2022-02-28 DIAGNOSIS — J3089 Other allergic rhinitis: Secondary | ICD-10-CM | POA: Diagnosis not present

## 2022-03-01 DIAGNOSIS — D170 Benign lipomatous neoplasm of skin and subcutaneous tissue of head, face and neck: Secondary | ICD-10-CM | POA: Diagnosis not present

## 2022-03-02 DIAGNOSIS — J301 Allergic rhinitis due to pollen: Secondary | ICD-10-CM | POA: Diagnosis not present

## 2022-03-02 DIAGNOSIS — J3089 Other allergic rhinitis: Secondary | ICD-10-CM | POA: Diagnosis not present

## 2022-03-02 DIAGNOSIS — J3081 Allergic rhinitis due to animal (cat) (dog) hair and dander: Secondary | ICD-10-CM | POA: Diagnosis not present

## 2022-03-04 DIAGNOSIS — J3081 Allergic rhinitis due to animal (cat) (dog) hair and dander: Secondary | ICD-10-CM | POA: Diagnosis not present

## 2022-03-04 DIAGNOSIS — J3089 Other allergic rhinitis: Secondary | ICD-10-CM | POA: Diagnosis not present

## 2022-03-04 DIAGNOSIS — J301 Allergic rhinitis due to pollen: Secondary | ICD-10-CM | POA: Diagnosis not present

## 2022-03-10 DIAGNOSIS — J301 Allergic rhinitis due to pollen: Secondary | ICD-10-CM | POA: Diagnosis not present

## 2022-03-10 DIAGNOSIS — J3089 Other allergic rhinitis: Secondary | ICD-10-CM | POA: Diagnosis not present

## 2022-03-10 DIAGNOSIS — J3081 Allergic rhinitis due to animal (cat) (dog) hair and dander: Secondary | ICD-10-CM | POA: Diagnosis not present

## 2022-03-11 ENCOUNTER — Ambulatory Visit: Payer: BC Managed Care – PPO | Admitting: Plastic Surgery

## 2022-03-11 VITALS — BP 115/74 | HR 95 | Temp 98.4°F | Resp 16 | Ht 66.93 in | Wt 185.2 lb

## 2022-03-11 DIAGNOSIS — Z01419 Encounter for gynecological examination (general) (routine) without abnormal findings: Secondary | ICD-10-CM | POA: Diagnosis not present

## 2022-03-11 DIAGNOSIS — Z6828 Body mass index (BMI) 28.0-28.9, adult: Secondary | ICD-10-CM | POA: Diagnosis not present

## 2022-03-11 DIAGNOSIS — D485 Neoplasm of uncertain behavior of skin: Secondary | ICD-10-CM

## 2022-03-11 DIAGNOSIS — D489 Neoplasm of uncertain behavior, unspecified: Secondary | ICD-10-CM

## 2022-03-12 NOTE — Progress Notes (Signed)
   Referring Provider Janie Morning, DO [address] [place],  [place] [address]   CC:  Neoplasm of right scalp  Alice Dominguez is an 46 y.o. female.  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  She desires excision.  She was referred by general surgery.  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  This has been present for more than 20 years but has grown.    Allergies  Allergen Reactions   Barley Green [Barley Grass]    Cat Hair Extract Swelling   Grass Pollen(K-O-R-T-Swt Vern) Swelling   Other     nuts   Molds & Smuts Rash   Penicillins Rash    Outpatient Encounter Medications as of 03/11/2022  Medication Sig   budesonide (PULMICORT FLEXHALER) 180 MCG/ACT inhaler 1 puff   budesonide-formoterol (SYMBICORT) 160-4.5 MCG/ACT inhaler Inhale 2 puffs into the lungs 2 (two) times daily.   azelastine (OPTIVAR) 0.05 % ophthalmic solution Place 1 drop into both eyes 2 (two) times daily.   betamethasone dipropionate 0.05 % cream Apply topically 2 (two) times daily.   clobetasol (TEMOVATE) 0.05 % external solution Apply topically daily.   Crisaborole (EUCRISA) 2 % OINT Apply 1 application topically 2 (two) times daily as needed.   EPINEPHrine 0.3 mg/0.3 mL IJ SOAJ injection Inject into the muscle.   EYSUVIS 0.25 % SUSP Apply to eye.   fluticasone (FLONASE) 50 MCG/ACT nasal spray SMARTSIG:1-2 Spray(s) Both Nares Daily   hydrocortisone cream 0.5 % Apply 1 application topically 2 (two) times daily.   hydrOXYzine (ATARAX/VISTARIL) 25 MG tablet Take 1 tablet (25 mg total) by mouth every 6 (six) hours as needed for itching.   montelukast (SINGULAIR) 10 MG tablet Take 1 tablet (10 mg total) by mouth at bedtime.   neomycin-polymyxin b-dexamethasone (MAXITROL) 3.5-10000-0.1 OINT    Olopatadine HCl (PATADAY) 0.7 % SOLN 1 drop into affected eye   No facility-administered encounter medications on file as of 03/11/2022.     Past Medical  History:  Diagnosis Date   Asthma    Seasonal allergies     Past Surgical History:  Procedure Laterality Date   DILATION AND EVACUATION     miscarriage   TONSILLECTOMY      Family History  Problem Relation Age of Onset   Breast cancer Maternal Grandmother    Healthy Mother    Healthy Father     Social History   Social History Narrative   ** Merged History Encounter **         Review of Systems General: Denies fevers, chills, weight loss CV: Denies chest pain, shortness of breath, palpitations   Physical Exam    03/11/2022    2:41 PM 12/23/2020    2:45 PM 12/23/2020    2:39 PM  Vitals with BMI  Height 5' 6.929"  5' 6.929"  Weight 185 lbs 3 oz    BMI 22.02    Systolic 542 706   Diastolic 74 82   Pulse 95 91     General:  No acute distress,  Alert and oriented, Non-Toxic, Normal speech and affect HEENT: 4 cm right scalp neoplasm.  It is mobile and soft.  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  We discussed that there may be some alopecia.    [name] 03/12/2022, 10:28 AM

## 2022-03-14 DIAGNOSIS — J3089 Other allergic rhinitis: Secondary | ICD-10-CM | POA: Diagnosis not present

## 2022-03-14 DIAGNOSIS — J301 Allergic rhinitis due to pollen: Secondary | ICD-10-CM | POA: Diagnosis not present

## 2022-03-14 DIAGNOSIS — J3081 Allergic rhinitis due to animal (cat) (dog) hair and dander: Secondary | ICD-10-CM | POA: Diagnosis not present

## 2022-03-18 DIAGNOSIS — J3081 Allergic rhinitis due to animal (cat) (dog) hair and dander: Secondary | ICD-10-CM | POA: Diagnosis not present

## 2022-03-18 DIAGNOSIS — J3089 Other allergic rhinitis: Secondary | ICD-10-CM | POA: Diagnosis not present

## 2022-03-18 DIAGNOSIS — J301 Allergic rhinitis due to pollen: Secondary | ICD-10-CM | POA: Diagnosis not present

## 2022-03-21 DIAGNOSIS — J301 Allergic rhinitis due to pollen: Secondary | ICD-10-CM | POA: Diagnosis not present

## 2022-03-21 DIAGNOSIS — J3081 Allergic rhinitis due to animal (cat) (dog) hair and dander: Secondary | ICD-10-CM | POA: Diagnosis not present

## 2022-03-21 DIAGNOSIS — J3089 Other allergic rhinitis: Secondary | ICD-10-CM | POA: Diagnosis not present

## 2022-03-24 DIAGNOSIS — J3081 Allergic rhinitis due to animal (cat) (dog) hair and dander: Secondary | ICD-10-CM | POA: Diagnosis not present

## 2022-03-24 DIAGNOSIS — J301 Allergic rhinitis due to pollen: Secondary | ICD-10-CM | POA: Diagnosis not present

## 2022-03-24 DIAGNOSIS — J3089 Other allergic rhinitis: Secondary | ICD-10-CM | POA: Diagnosis not present

## 2022-03-30 DIAGNOSIS — J301 Allergic rhinitis due to pollen: Secondary | ICD-10-CM | POA: Diagnosis not present

## 2022-03-30 DIAGNOSIS — J3081 Allergic rhinitis due to animal (cat) (dog) hair and dander: Secondary | ICD-10-CM | POA: Diagnosis not present

## 2022-03-30 DIAGNOSIS — J3089 Other allergic rhinitis: Secondary | ICD-10-CM | POA: Diagnosis not present

## 2022-04-04 DIAGNOSIS — J3089 Other allergic rhinitis: Secondary | ICD-10-CM | POA: Diagnosis not present

## 2022-04-04 DIAGNOSIS — J301 Allergic rhinitis due to pollen: Secondary | ICD-10-CM | POA: Diagnosis not present

## 2022-04-04 DIAGNOSIS — J3081 Allergic rhinitis due to animal (cat) (dog) hair and dander: Secondary | ICD-10-CM | POA: Diagnosis not present

## 2022-04-05 DIAGNOSIS — Z1212 Encounter for screening for malignant neoplasm of rectum: Secondary | ICD-10-CM | POA: Diagnosis not present

## 2022-04-05 DIAGNOSIS — Z1211 Encounter for screening for malignant neoplasm of colon: Secondary | ICD-10-CM | POA: Diagnosis not present

## 2022-04-08 DIAGNOSIS — J301 Allergic rhinitis due to pollen: Secondary | ICD-10-CM | POA: Diagnosis not present

## 2022-04-08 DIAGNOSIS — J3089 Other allergic rhinitis: Secondary | ICD-10-CM | POA: Diagnosis not present

## 2022-04-08 DIAGNOSIS — J3081 Allergic rhinitis due to animal (cat) (dog) hair and dander: Secondary | ICD-10-CM | POA: Diagnosis not present

## 2022-04-11 LAB — EXTERNAL GENERIC LAB PROCEDURE: COLOGUARD: NEGATIVE

## 2022-04-11 LAB — COLOGUARD: COLOGUARD: NEGATIVE

## 2022-04-12 ENCOUNTER — Telehealth: Payer: Self-pay | Admitting: *Deleted

## 2022-04-12 DIAGNOSIS — J301 Allergic rhinitis due to pollen: Secondary | ICD-10-CM | POA: Diagnosis not present

## 2022-04-12 DIAGNOSIS — J3089 Other allergic rhinitis: Secondary | ICD-10-CM | POA: Diagnosis not present

## 2022-04-12 DIAGNOSIS — J3081 Allergic rhinitis due to animal (cat) (dog) hair and dander: Secondary | ICD-10-CM | POA: Diagnosis not present

## 2022-04-12 NOTE — Telephone Encounter (Signed)
Call returned from vm left by pt. States she needs to reschedule her surgery currently scheduled for 9/123 with Dr. Erin Hearing. Advised someone will call tomorrow to reschedule. Message forwarded to [facility]. Pt call back # [phone]

## 2022-04-28 DIAGNOSIS — J3089 Other allergic rhinitis: Secondary | ICD-10-CM | POA: Diagnosis not present

## 2022-04-28 DIAGNOSIS — J3081 Allergic rhinitis due to animal (cat) (dog) hair and dander: Secondary | ICD-10-CM | POA: Diagnosis not present

## 2022-04-28 DIAGNOSIS — J301 Allergic rhinitis due to pollen: Secondary | ICD-10-CM | POA: Diagnosis not present

## 2022-05-05 DIAGNOSIS — J3089 Other allergic rhinitis: Secondary | ICD-10-CM | POA: Diagnosis not present

## 2022-05-05 DIAGNOSIS — J3081 Allergic rhinitis due to animal (cat) (dog) hair and dander: Secondary | ICD-10-CM | POA: Diagnosis not present

## 2022-05-05 DIAGNOSIS — J301 Allergic rhinitis due to pollen: Secondary | ICD-10-CM | POA: Diagnosis not present

## 2022-05-11 DIAGNOSIS — J301 Allergic rhinitis due to pollen: Secondary | ICD-10-CM | POA: Diagnosis not present

## 2022-05-11 DIAGNOSIS — Z91018 Allergy to other foods: Secondary | ICD-10-CM | POA: Diagnosis not present

## 2022-05-11 DIAGNOSIS — J3081 Allergic rhinitis due to animal (cat) (dog) hair and dander: Secondary | ICD-10-CM | POA: Diagnosis not present

## 2022-05-11 DIAGNOSIS — H1045 Other chronic allergic conjunctivitis: Secondary | ICD-10-CM | POA: Diagnosis not present

## 2022-05-11 DIAGNOSIS — J4530 Mild persistent asthma, uncomplicated: Secondary | ICD-10-CM | POA: Diagnosis not present

## 2022-05-11 DIAGNOSIS — J3089 Other allergic rhinitis: Secondary | ICD-10-CM | POA: Diagnosis not present

## 2022-05-11 DIAGNOSIS — R21 Rash and other nonspecific skin eruption: Secondary | ICD-10-CM | POA: Diagnosis not present

## 2022-05-18 DIAGNOSIS — J301 Allergic rhinitis due to pollen: Secondary | ICD-10-CM | POA: Diagnosis not present

## 2022-05-19 DIAGNOSIS — J301 Allergic rhinitis due to pollen: Secondary | ICD-10-CM | POA: Diagnosis not present

## 2022-05-19 DIAGNOSIS — J3081 Allergic rhinitis due to animal (cat) (dog) hair and dander: Secondary | ICD-10-CM | POA: Diagnosis not present

## 2022-05-19 DIAGNOSIS — J3089 Other allergic rhinitis: Secondary | ICD-10-CM | POA: Diagnosis not present

## 2022-05-23 ENCOUNTER — Ambulatory Visit (INDEPENDENT_AMBULATORY_CARE_PROVIDER_SITE_OTHER): Payer: BC Managed Care – PPO | Admitting: Physician Assistant

## 2022-05-23 ENCOUNTER — Encounter: Payer: Self-pay | Admitting: Physician Assistant

## 2022-05-23 VITALS — BP 124/79 | HR 78 | Ht 66.93 in | Wt 188.2 lb

## 2022-05-23 DIAGNOSIS — D489 Neoplasm of uncertain behavior, unspecified: Secondary | ICD-10-CM

## 2022-05-23 NOTE — Progress Notes (Signed)
Patient ID: Alice Dominguez. Alice Dominguez, female    DOB: 11/16/1975, 46 y.o.   MRN: [id number]  Chief Complaint  Patient presents with   Pre-op Exam    No diagnosis found.   History of Present Illness: Alice Dominguez. Alice Dominguez is a 46 y.o.  female  with a history of scalp neoplasm.  She presents for preoperative evaluation for upcoming procedure, excision of 4 cm right scalp subfacial mass, scheduled for 06/20/2022 with Dr. Erin Hearing.  The patient has not had problems with anesthesia.   Summary of Previous Visit: The patient was most recently seen in our office on 03/11/2022 by Dr. Erin Hearing.  She had noted a lesion to her right scalp over 20 years that has grown in size.  Denied any other complaints or concerns at that time.  After further discussion she did elect to proceed with operative management by Dr. Erin Hearing.  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  [place] Significant for: Scalp lesion   Past Medical History: Allergies: Allergies  Allergen Reactions   Barley Green [Barley Grass]    Cat Hair Extract Swelling   Grass Pollen(K-O-R-T-Swt Vern) Swelling   Other     nuts   Molds & Smuts Rash   Penicillins Rash    Current Medications:  Current Outpatient Medications:    betamethasone dipropionate 0.05 % cream, Apply topically 2 (two) times daily., Disp: , Rfl:    budesonide (PULMICORT FLEXHALER) 180 MCG/ACT inhaler, 1 puff, Disp: , Rfl:    budesonide-formoterol (SYMBICORT) 160-4.5 MCG/ACT inhaler, Inhale 2 puffs into the lungs 2 (two) times daily., Disp: , Rfl:    clobetasol (TEMOVATE) 0.05 % external solution, Apply topically daily., Disp: , Rfl:    EPINEPHrine 0.3 mg/0.3 mL IJ SOAJ injection, Inject into the muscle., Disp: , Rfl:    EYSUVIS 0.25 % SUSP, Apply to eye., Disp: , Rfl:    fluticasone (FLONASE) 50 MCG/ACT nasal spray, SMARTSIG:1-2 Spray(s) Both Nares Daily, Disp: , Rfl:    hydrocortisone cream 0.5 %, Apply 1 application topically 2 (two) times daily., Disp: 30 g, Rfl:  0   neomycin-polymyxin b-dexamethasone (MAXITROL) 3.5-10000-0.1 OINT, , Disp: , Rfl:    Olopatadine HCl (PATADAY) 0.7 % SOLN, 1 drop into affected eye, Disp: , Rfl:   Past Medical Problems: Past Medical History:  Diagnosis Date   Asthma    Seasonal allergies     Past Surgical History: Past Surgical History:  Procedure Laterality Date   DILATION AND EVACUATION     miscarriage   TONSILLECTOMY      Social History: Social History   Socioeconomic History   Marital status: Married    Spouse name: Not on file   Number of children: Not on file   Years of education: Not on file   Highest education level: Not on file  Occupational History   Not on file  Tobacco Use   Smoking status: Never   Smokeless tobacco: Never  Vaping Use   Vaping Use: Never used  Substance and Sexual Activity   Alcohol use: Never   Drug use: Never   Sexual activity: Not on file  Other Topics Concern   Not on file  Social History Narrative   ** Merged History Encounter **       Social Determinants of Health   Financial Resource Strain: Not on file  Food Insecurity: Not on file  Transportation Needs: Not on file  Physical Activity: Not on file  Stress: Not on file  Social Connections: Not on  file  Intimate Partner Violence: Not on file    Family History: Family History  Problem Relation Age of Onset   Breast cancer Maternal Grandmother    Healthy Mother    Healthy Father     Review of Systems: ROS  Physical Exam: Vital Signs BP 124/79 (BP Location: Left Arm, Patient Position: Sitting, Cuff Size: Large)   Pulse 78   Ht 5' 6.93" (1.7 m)   Wt 188 lb 3.2 oz (85.4 kg)   SpO2 97%   BMI 29.54 kg/m   Physical Exam  Constitutional:      General: Not in acute distress.    Appearance: Normal appearance. Not ill-appearing.  HENT:     Head: 4 cm right scalp lesion mobile and soft  Cardiovascular:     Rate and Rhythm: Normal rate.    Pulses: Normal pulses.  Pulmonary:     Effort: No  respiratory distress or increased work of breathing.  Speaks in full sentences. Abdominal:     General: Abdomen is flat. No distension.   Musculoskeletal: Normal range of motion. No lower extremity swelling or edema. No varicosities.  Skin:    General: Skin is warm and dry.     Findings: No erythema or rash.  Neurological:     Mental Status: Alert and oriented to person, place, and time.  Psychiatric:        Mood and Affect: Mood normal.        Behavior: Behavior normal.      Assessment/Plan: The patient is scheduled for excision of 4 cm right scalp subfascial mass with Dr. Erin Hearing.  Risks, benefits, and alternatives of procedure discussed, questions answered and consent obtained.    Smoking Status: Non-smoker   Caprini Score: 3; Risk Factors include: , BMI, age.  recommendation early ambulation.  Pictures obtained: None  Post-op Rx sent to pharmacy: No medications indicated  Patient was provided with the  General Surgical Risk consent document and Pain Medication Agreement prior to their appointment.  They had adequate time to read through the risk consent documents and Pain Medication Agreement. We also discussed them in person together during this preop appointment. All of their questions were answered to their satisfaction.  Recommended calling if they have any further questions.  Risk consent form and Pain Medication Agreement to be scanned into patient's chart.     Electronically signed by: Stevie Kern Durell Lofaso, PA-C 05/23/2022 2:33 PM

## 2022-05-23 NOTE — H&P (View-Only) (Signed)
Patient ID: Alice Dominguez. Alice Dominguez, female    DOB: 1976/02/18, 46 y.o.   MRN: [id number]  Chief Complaint  Patient presents with   Pre-op Exam    No diagnosis found.   History of Present Illness: Alice Dominguez is a 46 y.o.  female  with a history of scalp neoplasm.  She presents for preoperative evaluation for upcoming procedure, excision of 4 cm right scalp subfacial mass, scheduled for 06/20/2022 with Dr. Erin Hearing.  The patient has not had problems with anesthesia.   Summary of Previous Visit: The patient was most recently seen in our office on 03/11/2022 by Dr. Erin Hearing.  She had noted a lesion to her right scalp over 20 years that has grown in size.  Denied any other complaints or concerns at that time.  After further discussion she did elect to proceed with operative management by Dr. Erin Hearing.  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   Past Medical History: Allergies: Allergies  Allergen Reactions   Barley Green [Barley Grass]    Cat Hair Extract Swelling   Grass Pollen(K-O-R-T-Swt Vern) Swelling   Other     nuts   Molds & Smuts Rash   Penicillins Rash    Current Medications:  Current Outpatient Medications:    betamethasone dipropionate 0.05 % cream, Apply topically 2 (two) times daily., Disp: , Rfl:    budesonide (PULMICORT FLEXHALER) 180 MCG/ACT inhaler, 1 puff, Disp: , Rfl:    budesonide-formoterol (SYMBICORT) 160-4.5 MCG/ACT inhaler, Inhale 2 puffs into the lungs 2 (two) times daily., Disp: , Rfl:    clobetasol (TEMOVATE) 0.05 % external solution, Apply topically daily., Disp: , Rfl:    EPINEPHrine 0.3 mg/0.3 mL IJ SOAJ injection, Inject into the muscle., Disp: , Rfl:    EYSUVIS 0.25 % SUSP, Apply to eye., Disp: , Rfl:    fluticasone (FLONASE) 50 MCG/ACT nasal spray, SMARTSIG:1-2 Spray(s) Both Nares Daily, Disp: , Rfl:    hydrocortisone cream 0.5 %, Apply 1 application topically 2 (two) times daily., Disp: 30 g, Rfl:  0   neomycin-polymyxin b-dexamethasone (MAXITROL) 3.5-10000-0.1 OINT, , Disp: , Rfl:    Olopatadine HCl (PATADAY) 0.7 % SOLN, 1 drop into affected eye, Disp: , Rfl:   Past Medical Problems: Past Medical History:  Diagnosis Date   Asthma    Seasonal allergies     Past Surgical History: Past Surgical History:  Procedure Laterality Date   DILATION AND EVACUATION     miscarriage   TONSILLECTOMY      Social History: Social History   Socioeconomic History   Marital status: Married    Spouse name: Not on file   Number of children: Not on file   Years of education: Not on file   Highest education level: Not on file  Occupational History   Not on file  Tobacco Use   Smoking status: Never   Smokeless tobacco: Never  Vaping Use   Vaping Use: Never used  Substance and Sexual Activity   Alcohol use: Never   Drug use: Never   Sexual activity: Not on file  Other Topics Concern   Not on file  Social History Narrative   ** Merged History Encounter **       Social Determinants of Health   Financial Resource Strain: Not on file  Food Insecurity: Not on file  Transportation Needs: Not on file  Physical Activity: Not on file  Stress: Not on file  Social Connections: Not on  file  Intimate Partner Violence: Not on file    Family History: Family History  Problem Relation Age of Onset   Breast cancer Maternal Grandmother    Healthy Mother    Healthy Father     Review of Systems: ROS  Physical Exam: Vital Signs BP 124/79 (BP Location: Left Arm, Patient Position: Sitting, Cuff Size: Large)   Pulse 78   Ht 5' 6.93" (1.7 m)   Wt 188 lb 3.2 oz (85.4 kg)   SpO2 97%   BMI 29.54 kg/m   Physical Exam  Constitutional:      General: Not in acute distress.    Appearance: Normal appearance. Not ill-appearing.  HENT:     Head: 4 cm right scalp lesion mobile and soft  Cardiovascular:     Rate and Rhythm: Normal rate.    Pulses: Normal pulses.  Pulmonary:     Effort: No  respiratory distress or increased work of breathing.  Speaks in full sentences. Abdominal:     General: Abdomen is flat. No distension.   Musculoskeletal: Normal range of motion. No lower extremity swelling or edema. No varicosities.  Skin:    General: Skin is warm and dry.     Findings: No erythema or rash.  Neurological:     Mental Status: Alert and oriented to person, place, and time.  Psychiatric:        Mood and Affect: Mood normal.        Behavior: Behavior normal.      Assessment/Plan: The patient is scheduled for excision of 4 cm right scalp subfascial mass with Dr. Erin Hearing.  Risks, benefits, and alternatives of procedure discussed, questions answered and consent obtained.    Smoking Status: Non-smoker   Caprini Score: 3; Risk Factors include: , BMI, age.  recommendation early ambulation.  Pictures obtained: None  Post-op Rx sent to pharmacy: No medications indicated  Patient was provided with the  General Surgical Risk consent document and Pain Medication Agreement prior to their appointment.  They had adequate time to read through the risk consent documents and Pain Medication Agreement. We also discussed them in person together during this preop appointment. All of their questions were answered to their satisfaction.  Recommended calling if they have any further questions.  Risk consent form and Pain Medication Agreement to be scanned into patient's chart.     Electronically signed by: Stevie Kern Ilissa Rosner, PA-C 05/23/2022 2:33 PM

## 2022-05-26 DIAGNOSIS — J3081 Allergic rhinitis due to animal (cat) (dog) hair and dander: Secondary | ICD-10-CM | POA: Diagnosis not present

## 2022-05-26 DIAGNOSIS — J3089 Other allergic rhinitis: Secondary | ICD-10-CM | POA: Diagnosis not present

## 2022-05-26 DIAGNOSIS — J301 Allergic rhinitis due to pollen: Secondary | ICD-10-CM | POA: Diagnosis not present

## 2022-06-02 DIAGNOSIS — J301 Allergic rhinitis due to pollen: Secondary | ICD-10-CM | POA: Diagnosis not present

## 2022-06-02 DIAGNOSIS — J3089 Other allergic rhinitis: Secondary | ICD-10-CM | POA: Diagnosis not present

## 2022-06-02 DIAGNOSIS — J3081 Allergic rhinitis due to animal (cat) (dog) hair and dander: Secondary | ICD-10-CM | POA: Diagnosis not present

## 2022-06-09 ENCOUNTER — Other Ambulatory Visit: Payer: Self-pay

## 2022-06-09 ENCOUNTER — Encounter (HOSPITAL_BASED_OUTPATIENT_CLINIC_OR_DEPARTMENT_OTHER): Payer: Self-pay | Admitting: Plastic Surgery

## 2022-06-09 DIAGNOSIS — J3089 Other allergic rhinitis: Secondary | ICD-10-CM | POA: Diagnosis not present

## 2022-06-09 DIAGNOSIS — J301 Allergic rhinitis due to pollen: Secondary | ICD-10-CM | POA: Diagnosis not present

## 2022-06-09 DIAGNOSIS — J3081 Allergic rhinitis due to animal (cat) (dog) hair and dander: Secondary | ICD-10-CM | POA: Diagnosis not present

## 2022-06-16 DIAGNOSIS — J3089 Other allergic rhinitis: Secondary | ICD-10-CM | POA: Diagnosis not present

## 2022-06-16 DIAGNOSIS — J301 Allergic rhinitis due to pollen: Secondary | ICD-10-CM | POA: Diagnosis not present

## 2022-06-16 DIAGNOSIS — J3081 Allergic rhinitis due to animal (cat) (dog) hair and dander: Secondary | ICD-10-CM | POA: Diagnosis not present

## 2022-06-17 ENCOUNTER — Encounter: Payer: BC Managed Care – PPO | Admitting: Plastic Surgery

## 2022-06-20 ENCOUNTER — Ambulatory Visit (HOSPITAL_BASED_OUTPATIENT_CLINIC_OR_DEPARTMENT_OTHER): Payer: BC Managed Care – PPO | Admitting: Anesthesiology

## 2022-06-20 ENCOUNTER — Encounter (HOSPITAL_BASED_OUTPATIENT_CLINIC_OR_DEPARTMENT_OTHER)
Admission: RE | Disposition: A | Discharge status: Home / Self Care | Payer: Self-pay | Source: Home / Self Care | Attending: Plastic Surgery

## 2022-06-20 ENCOUNTER — Ambulatory Visit (HOSPITAL_BASED_OUTPATIENT_CLINIC_OR_DEPARTMENT_OTHER)
Admission: RE | Admit: 2022-06-20 | Discharge: 2022-06-20 | Disposition: A | Discharge status: Home / Self Care | Payer: BC Managed Care – PPO | Attending: Plastic Surgery | Admitting: Plastic Surgery

## 2022-06-20 ENCOUNTER — Encounter (HOSPITAL_BASED_OUTPATIENT_CLINIC_OR_DEPARTMENT_OTHER): Payer: Self-pay | Admitting: Plastic Surgery

## 2022-06-20 ENCOUNTER — Encounter: Payer: BC Managed Care – PPO | Admitting: Plastic Surgery

## 2022-06-20 ENCOUNTER — Other Ambulatory Visit: Payer: Self-pay

## 2022-06-20 DIAGNOSIS — Z01818 Encounter for other preprocedural examination: Secondary | ICD-10-CM

## 2022-06-20 DIAGNOSIS — J45909 Unspecified asthma, uncomplicated: Secondary | ICD-10-CM | POA: Insufficient documentation

## 2022-06-20 DIAGNOSIS — D170 Benign lipomatous neoplasm of skin and subcutaneous tissue of head, face and neck: Secondary | ICD-10-CM | POA: Diagnosis not present

## 2022-06-20 DIAGNOSIS — D1779 Benign lipomatous neoplasm of other sites: Secondary | ICD-10-CM

## 2022-06-20 DIAGNOSIS — Z79899 Other long term (current) drug therapy: Secondary | ICD-10-CM | POA: Insufficient documentation

## 2022-06-20 DIAGNOSIS — Z7951 Long term (current) use of inhaled steroids: Secondary | ICD-10-CM | POA: Diagnosis not present

## 2022-06-20 DIAGNOSIS — M7989 Other specified soft tissue disorders: Secondary | ICD-10-CM | POA: Diagnosis not present

## 2022-06-20 HISTORY — PX: EXCISION MASS HEAD: SHX6702

## 2022-06-20 LAB — POCT PREGNANCY, URINE: Preg Test, Ur: NEGATIVE

## 2022-06-20 SURGERY — EXCISION, MASS, HEAD
Anesthesia: General | Site: Scalp | Laterality: Right

## 2022-06-20 MED ORDER — ONDANSETRON HCL 4 MG/2ML IJ SOLN
INTRAMUSCULAR | Status: DC | PRN
  Administered 2022-06-20: 4 mg via INTRAVENOUS

## 2022-06-20 MED ORDER — LIDOCAINE-EPINEPHRINE 1 %-1:100000 IJ SOLN
INTRAMUSCULAR | Status: AC
  Filled 2022-06-20: qty 1

## 2022-06-20 MED ORDER — DEXAMETHASONE SODIUM PHOSPHATE 10 MG/ML IJ SOLN
INTRAMUSCULAR | Status: DC | PRN
  Administered 2022-06-20: 10 mg via INTRAVENOUS

## 2022-06-20 MED ORDER — CEFAZOLIN SODIUM-DEXTROSE 2-4 GM/100ML-% IV SOLN
2.0000 g | Freq: Once | INTRAVENOUS | Status: AC
  Administered 2022-06-20: 2 g via INTRAVENOUS

## 2022-06-20 MED ORDER — ONDANSETRON HCL 4 MG/2ML IJ SOLN
INTRAMUSCULAR | Status: AC
  Filled 2022-06-20: qty 2

## 2022-06-20 MED ORDER — CHLORHEXIDINE GLUCONATE CLOTH 2 % EX PADS: 6.0000 | MEDICATED_PAD | Freq: Once | CUTANEOUS | Status: DC

## 2022-06-20 MED ORDER — FENTANYL CITRATE (PF) 100 MCG/2ML IJ SOLN
INTRAMUSCULAR | Status: AC
  Filled 2022-06-20: qty 2

## 2022-06-20 MED ORDER — LIDOCAINE HCL (CARDIAC) PF 100 MG/5ML IV SOSY
PREFILLED_SYRINGE | INTRAVENOUS | Status: DC | PRN
  Administered 2022-06-20: 100 mg via INTRATRACHEAL

## 2022-06-20 MED ORDER — BACITRACIN ZINC 500 UNIT/GM EX OINT
TOPICAL_OINTMENT | CUTANEOUS | Status: AC
  Filled 2022-06-20: qty 1.8

## 2022-06-20 MED ORDER — LIDOCAINE-EPINEPHRINE (PF) 1 %-1:200000 IJ SOLN
INTRAMUSCULAR | Status: AC
  Filled 2022-06-20: qty 30

## 2022-06-20 MED ORDER — LIDOCAINE 2% (20 MG/ML) 5 ML SYRINGE
INTRAMUSCULAR | Status: AC
  Filled 2022-06-20: qty 5

## 2022-06-20 MED ORDER — BACITRACIN 500 UNIT/GM EX OINT
TOPICAL_OINTMENT | CUTANEOUS | Status: DC | PRN
  Administered 2022-06-20: 1 via TOPICAL

## 2022-06-20 MED ORDER — 0.9 % SODIUM CHLORIDE (POUR BTL) OPTIME
TOPICAL | Status: DC | PRN
  Administered 2022-06-20: 10 mL

## 2022-06-20 MED ORDER — MIDAZOLAM HCL 5 MG/5ML IJ SOLN
INTRAMUSCULAR | Status: DC | PRN
  Administered 2022-06-20: 2 mg via INTRAVENOUS

## 2022-06-20 MED ORDER — LACTATED RINGERS IV SOLN: INTRAVENOUS | Status: DC

## 2022-06-20 MED ORDER — FENTANYL CITRATE (PF) 100 MCG/2ML IJ SOLN: 25.0000 ug | INTRAMUSCULAR | Status: DC | PRN

## 2022-06-20 MED ORDER — FENTANYL CITRATE (PF) 100 MCG/2ML IJ SOLN
INTRAMUSCULAR | Status: DC | PRN
  Administered 2022-06-20: 50 ug via INTRAVENOUS

## 2022-06-20 MED ORDER — MIDAZOLAM HCL 2 MG/2ML IJ SOLN
INTRAMUSCULAR | Status: AC
  Filled 2022-06-20: qty 2

## 2022-06-20 MED ORDER — OXYCODONE HCL 5 MG/5ML PO SOLN: 5.0000 mg | Freq: Once | ORAL | Status: DC | PRN

## 2022-06-20 MED ORDER — ONDANSETRON HCL 4 MG/2ML IJ SOLN: 4.0000 mg | Freq: Once | INTRAMUSCULAR | Status: DC | PRN

## 2022-06-20 MED ORDER — BUPIVACAINE HCL (PF) 0.25 % IJ SOLN
INTRAMUSCULAR | Status: AC
  Filled 2022-06-20: qty 30

## 2022-06-20 MED ORDER — OXYCODONE HCL 5 MG PO TABS: 5.0000 mg | ORAL_TABLET | Freq: Once | ORAL | Status: DC | PRN

## 2022-06-20 MED ORDER — BUPIVACAINE-EPINEPHRINE (PF) 0.25% -1:200000 IJ SOLN
INTRAMUSCULAR | Status: AC
  Filled 2022-06-20: qty 30

## 2022-06-20 MED ORDER — BUPIVACAINE-EPINEPHRINE 0.25% -1:200000 IJ SOLN
INTRAMUSCULAR | Status: DC | PRN
  Administered 2022-06-20: 10 mL

## 2022-06-20 MED ORDER — KETOROLAC TROMETHAMINE 30 MG/ML IJ SOLN: 30.0000 mg | Freq: Once | INTRAMUSCULAR | Status: DC | PRN

## 2022-06-20 MED ORDER — DEXAMETHASONE SODIUM PHOSPHATE 10 MG/ML IJ SOLN
INTRAMUSCULAR | Status: AC
  Filled 2022-06-20: qty 1

## 2022-06-20 MED ORDER — PROPOFOL 10 MG/ML IV BOLUS
INTRAVENOUS | Status: DC | PRN
  Administered 2022-06-20: 200 mg via INTRAVENOUS

## 2022-06-20 SURGICAL SUPPLY — 72 items
ADH SKN CLS APL DERMABOND .7 (GAUZE/BANDAGES/DRESSINGS)
APL PRP STRL LF DISP 70% ISPRP (MISCELLANEOUS) ×1
APL SKNCLS STERI-STRIP NONHPOA (GAUZE/BANDAGES/DRESSINGS)
BAND INSRT 18 STRL LF DISP RB (MISCELLANEOUS)
BAND RUBBER #18 3X1/16 STRL (MISCELLANEOUS) IMPLANT
BENZOIN TINCTURE PRP APPL 2/3 (GAUZE/BANDAGES/DRESSINGS) IMPLANT
BLADE CLIPPER SURG (BLADE) IMPLANT
BLADE SURG 15 STRL LF DISP TIS (BLADE) ×1 IMPLANT
BLADE SURG 15 STRL SS (BLADE) ×1
CANISTER SUCT 1200ML W/VALVE (MISCELLANEOUS) IMPLANT
CHLORAPREP W/TINT 26 (MISCELLANEOUS) ×1 IMPLANT
COVER BACK TABLE 60X90IN (DRAPES) ×1 IMPLANT
COVER MAYO STAND STRL (DRAPES) ×1 IMPLANT
DERMABOND ADVANCED .7 DNX12 (GAUZE/BANDAGES/DRESSINGS) IMPLANT
DRAIN JP 10F RND SILICONE (MISCELLANEOUS) IMPLANT
DRAPE U-SHAPE 76X120 STRL (DRAPES) ×1 IMPLANT
DRAPE UTILITY XL STRL (DRAPES) ×1 IMPLANT
DRSG TELFA 3X8 NADH STRL (GAUZE/BANDAGES/DRESSINGS) IMPLANT
ELECT COATED BLADE 2.86 ST (ELECTRODE) IMPLANT
ELECT NDL BLADE 2-5/6 (NEEDLE) ×1 IMPLANT
ELECT NEEDLE BLADE 2-5/6 (NEEDLE) ×1 IMPLANT
ELECT REM PT RETURN 9FT ADLT (ELECTROSURGICAL)
ELECT REM PT RETURN 9FT PED (ELECTROSURGICAL)
ELECTRODE REM PT RETRN 9FT PED (ELECTROSURGICAL) IMPLANT
ELECTRODE REM PT RTRN 9FT ADLT (ELECTROSURGICAL) IMPLANT
EVACUATOR SILICONE 100CC (DRAIN) IMPLANT
GAUZE SPONGE 4X4 12PLY STRL LF (GAUZE/BANDAGES/DRESSINGS) IMPLANT
GAUZE XEROFORM 1X8 LF (GAUZE/BANDAGES/DRESSINGS) IMPLANT
GLOVE BIOGEL M STRL SZ7.5 (GLOVE) IMPLANT
GLOVE BIOGEL PI IND STRL 7.5 (GLOVE) ×1 IMPLANT
GLOVE SURG SS PI 7.5 STRL IVOR (GLOVE) ×1 IMPLANT
GOWN STRL REUS W/ TWL LRG LVL3 (GOWN DISPOSABLE) ×1 IMPLANT
GOWN STRL REUS W/ TWL XL LVL3 (GOWN DISPOSABLE) ×1 IMPLANT
GOWN STRL REUS W/TWL LRG LVL3 (GOWN DISPOSABLE) ×1
GOWN STRL REUS W/TWL XL LVL3 (GOWN DISPOSABLE) ×1
MARKER SKIN DUAL TIP RULER LAB (MISCELLANEOUS) IMPLANT
NDL FILTER BLUNT 18X1 1/2 (NEEDLE) IMPLANT
NDL HYPO 27GX1-1/4 (NEEDLE) ×1 IMPLANT
NDL HYPO 30GX1 BEV (NEEDLE) IMPLANT
NDL SAFETY ECLIP 18X1.5 (MISCELLANEOUS) IMPLANT
NEEDLE FILTER BLUNT 18X1 1/2 (NEEDLE) IMPLANT
NEEDLE HYPO 27GX1-1/4 (NEEDLE) ×1 IMPLANT
NEEDLE HYPO 30GX1 BEV (NEEDLE) IMPLANT
NS IRRIG 1000ML POUR BTL (IV SOLUTION) IMPLANT
PACK BASIN DAY SURGERY FS (CUSTOM PROCEDURE TRAY) ×1 IMPLANT
PENCIL SMOKE EVACUATOR (MISCELLANEOUS) ×1 IMPLANT
SHEET MEDIUM DRAPE 40X70 STRL (DRAPES) IMPLANT
SLEEVE SCD COMPRESS KNEE MED (STOCKING) IMPLANT
SPONGE GAUZE 2X2 8PLY STRL LF (GAUZE/BANDAGES/DRESSINGS) IMPLANT
SPONGE T-LAP 18X18 ~~LOC~~+RFID (SPONGE) IMPLANT
STAPLER INSORB 30 2030 C-SECTI (MISCELLANEOUS) IMPLANT
STAPLER VISISTAT 35W (STAPLE) ×1 IMPLANT
STRIP CLOSURE SKIN 1/2X4 (GAUZE/BANDAGES/DRESSINGS) IMPLANT
SUCTION FRAZIER HANDLE 10FR (MISCELLANEOUS)
SUCTION TUBE FRAZIER 10FR DISP (MISCELLANEOUS) IMPLANT
SUT ETHILON 4 0 PS 2 18 (SUTURE) IMPLANT
SUT MNCRL AB 4-0 PS2 18 (SUTURE) IMPLANT
SUT PDS 3-0 CT2 (SUTURE)
SUT PDS II 3-0 CT2 27 ABS (SUTURE) IMPLANT
SUT VICRYL 3-0 CR8 SH (SUTURE) IMPLANT
SUT VICRYL 4-0 PS2 18IN ABS (SUTURE) IMPLANT
SUT VLOC 90 P-14 23 (SUTURE) IMPLANT
SWAB COLLECTION DEVICE MRSA (MISCELLANEOUS) IMPLANT
SWAB CULTURE ESWAB REG 1ML (MISCELLANEOUS) IMPLANT
SYR 50ML LL SCALE MARK (SYRINGE) IMPLANT
SYR BULB EAR ULCER 3OZ GRN STR (SYRINGE) IMPLANT
SYR CONTROL 10ML LL (SYRINGE) ×1 IMPLANT
TOWEL GREEN STERILE FF (TOWEL DISPOSABLE) ×1 IMPLANT
TRAY DSU PREP LF (CUSTOM PROCEDURE TRAY) IMPLANT
TUBE CONNECTING 20X1/4 (TUBING) IMPLANT
TUBING INFILTRATION IT-10001 (TUBING) IMPLANT
YANKAUER SUCT BULB TIP NO VENT (SUCTIONS) IMPLANT

## 2022-06-20 NOTE — Discharge Instructions (Addendum)
Activity as tolerated. NO showers for 24 hours. It is okay to wash hair 24 hours after surgery. Be careful washing in the area. You have multiple staples which we will remove ~10-14 days after surgery. NO driving No heavy activities  Diet: Regular  Special Instructions:  Call doctor if any unusual problems occur such as pain, excessive bleeding, unrelieved nausea/vomiting, fever &/or chills  Follow-up appointment: Previously scheduled.     Post Anesthesia Home Care Instructions  Activity: Get plenty of rest for the remainder of the day. A responsible individual must stay with you for 24 hours following the procedure.  For the next 24 hours, DO NOT: -Drive a car -Paediatric nurse -Drink alcoholic beverages -Take any medication unless instructed by your physician -Make any legal decisions or sign important papers.  Meals: Start with liquid foods such as gelatin or soup. Progress to regular foods as tolerated. Avoid greasy, spicy, heavy foods. If nausea and/or vomiting occur, drink only clear liquids until the nausea and/or vomiting subsides. Call your physician if vomiting continues.  Special Instructions/Symptoms: Your throat may feel dry or sore from the anesthesia or the breathing tube placed in your throat during surgery. If this causes discomfort, gargle with warm salt water. The discomfort should disappear within 24 hours.

## 2022-06-20 NOTE — Anesthesia Postprocedure Evaluation (Signed)
Anesthesia Post Note  Patient: Alice Dominguez. Quenton Fetter  Procedure(s) Performed: EXCISION SUBFASCIAL MASS HEAD RIGHT SCALP (Right: Scalp)     Patient location during evaluation: PACU Anesthesia Type: General Level of consciousness: awake and alert Pain management: pain level controlled Vital Signs Assessment: post-procedure vital signs reviewed and stable Respiratory status: spontaneous breathing, nonlabored ventilation, respiratory function stable and patient connected to nasal cannula oxygen Cardiovascular status: blood pressure returned to baseline and stable Postop Assessment: no apparent nausea or vomiting Anesthetic complications: no   No notable events documented.  Last Vitals:  Vitals:   06/20/22 1230 06/20/22 1235  BP: 112/77 118/70  Pulse: 79 71  Resp: 13 12  Temp:    SpO2: 95% 94%    Last Pain:  Vitals:   06/20/22 1235  TempSrc:   PainSc: 0-No pain                 [name] S

## 2022-06-20 NOTE — Interval H&P Note (Signed)
History and Physical Interval Note:  06/20/2022 11:04 AM  Alice Dominguez. [name]  has presented today for surgery, with the diagnosis of Neoplasm, uncertain whether benign or malignant.  The various methods of treatment have been discussed with the patient and family. After consideration of risks, benefits and other options for treatment, the patient has consented to  Procedure(s) with comments: EXCISION SUBFASCIAL MASS HEAD RIGHT SCALP (Right) - 1 hour as a surgical intervention.  The patient's history has been reviewed, patient examined, no change in status, stable for surgery.  I have reviewed the patient's chart and labs.  Questions were answered to the patient's satisfaction.     [name]

## 2022-06-20 NOTE — Op Note (Addendum)
Operative Note   DATE OF OPERATION: 06/20/2022  SURGICAL DEPARTMENT: Plastic Surgery  PREOPERATIVE DIAGNOSES:  right scalp mass  POSTOPERATIVE DIAGNOSES:  same  PROCEDURE:   Excision of 3.5 cm subfascial scalp mass, right scalp 3.5 cm complex closure right scalp  SURGEON: Olon Russ P. Jorgina Binning, MD  ASSISTANT: Verdie Shire, PA-C  ANESTHESIA:  General.   COMPLICATIONS: None.   INDICATIONS FOR PROCEDURE:  The patient, Alice Dominguez is a 46 y.o. female born on [birth year], is here for treatment of right subfascial scalp mass MRN: [id number]  CONSENT:  Informed consent was obtained directly from the patient. Risks, benefits and alternatives were fully discussed. Specific risks including but not limited to bleeding, infection, hematoma, seroma, scarring, pain, contracture, asymmetry, wound healing problems, and need for further surgery were all discussed. The patient did have an ample opportunity to have questions answered to satisfaction.   DESCRIPTION OF PROCEDURE:  The patient was taken to the operating room. SCDs were placed and preop antibiotics were given. General anesthesia was administered.  The patient's operative site was prepped and draped in a sterile fashion. A time out was performed and all information was confirmed to be correct.    Quarter percent Marcaine was used to inject the skin and surrounding tissues.  A 15 blade was used to make a skin incision.  Bovie electrocautery was used to dissect further.  The fatty mass was easily identified and was removed with a combination of sharp and blunt scissor dissection.  The lesion was passed off the table and sent for pathology.  It was passed off the table intact.  Wide local undermining was performed in all directions.  Following this Bovie electrocautery was used to confirm hemostasis.  Interrupted 3-0 Vicryl sutures were used to close the deep tissue followed by staples for skin.  The advanced practice practitioner  (APP) assisted throughout the case.  The APP was essential in retraction and counter traction when needed to make the case progress smoothly.  This retraction and assistance made it possible to see the tissue planes for the procedure.  The assistance was needed for hemostasis, tissue re-approximation and closure of the incision site.    The patient tolerated the procedure well.  There were no complications. The patient was allowed to wake from anesthesia, extubated and taken to the recovery room in satisfactory condition.

## 2022-06-20 NOTE — Anesthesia Preprocedure Evaluation (Addendum)
Anesthesia Evaluation  Patient identified by MRN, date of birth, ID band Patient awake    Reviewed: Allergy & Precautions, NPO status , Patient's Chart, lab work & pertinent test results  Airway Mallampati: II  TM Distance: >3 FB Neck ROM: Full    Dental no notable dental hx.    Pulmonary asthma ,    Pulmonary exam normal breath sounds clear to auscultation       Cardiovascular negative cardio ROS Normal cardiovascular exam Rhythm:Regular Rate:Normal     Neuro/Psych negative neurological ROS  negative psych ROS   GI/Hepatic negative GI ROS, Neg liver ROS,   Endo/Other  negative endocrine ROS  Renal/GU negative Renal ROS  negative genitourinary   Musculoskeletal negative musculoskeletal ROS (+)   Abdominal   Peds negative pediatric ROS (+)  Hematology negative hematology ROS (+)   Anesthesia Other Findings   Reproductive/Obstetrics negative OB ROS                             Anesthesia Physical Anesthesia Plan  ASA: 2  Anesthesia Plan: General   Post-op Pain Management: Minimal or no pain anticipated   Induction: Intravenous  PONV Risk Score and Plan: 3 and Ondansetron, Dexamethasone, Midazolam and Treatment may vary due to age or medical condition  Airway Management Planned: LMA  Additional Equipment:   Intra-op Plan:   Post-operative Plan: Extubation in OR  Informed Consent: I have reviewed the patients History and Physical, chart, labs and discussed the procedure including the risks, benefits and alternatives for the proposed anesthesia with the patient or authorized representative who has indicated his/her understanding and acceptance.     Dental advisory given  Plan Discussed with: CRNA and Surgeon  Anesthesia Plan Comments:         Anesthesia Quick Evaluation

## 2022-06-20 NOTE — Transfer of Care (Signed)
Immediate Anesthesia Transfer of Care Note  Patient: Alice Dominguez. Quenton Fetter  Procedure(s) Performed: EXCISION SUBFASCIAL MASS HEAD RIGHT SCALP (Right: Scalp)  Patient Location: PACU  Anesthesia Type:General  Level of Consciousness: drowsy and patient cooperative  Airway & Oxygen Therapy: Patient Spontanous Breathing and Patient connected to face mask oxygen  Post-op Assessment: Report given to RN and Post -op Vital signs reviewed and stable  Post vital signs: Reviewed and stable  Last Vitals:  Vitals Value Taken Time  BP 106/61 06/20/22 1216  Temp    Pulse 94 06/20/22 1217  Resp 13 06/20/22 1217  SpO2 97 % 06/20/22 1217  Vitals shown include unvalidated device data.  Last Pain:  Vitals:   06/20/22 0932  TempSrc: Oral  PainSc: 0-No pain         Complications: No notable events documented.

## 2022-06-20 NOTE — Anesthesia Procedure Notes (Signed)
Procedure Name: LMA Insertion Date/Time: 06/20/2022 11:38 AM  Performed by: Glory Buff, CRNAPre-anesthesia Checklist: Patient identified, Emergency Drugs available, Suction available and Patient being monitored Patient Re-evaluated:Patient Re-evaluated prior to induction Oxygen Delivery Method: Circle system utilized Preoxygenation: Pre-oxygenation with 100% oxygen Induction Type: IV induction LMA: LMA inserted LMA Size: 4.0 Number of attempts: 1 Placement Confirmation: positive ETCO2 Tube secured with: Tape Dental Injury: Teeth and Oropharynx as per pre-operative assessment

## 2022-06-21 ENCOUNTER — Other Ambulatory Visit: Payer: Self-pay

## 2022-06-21 LAB — SURGICAL PATHOLOGY

## 2022-07-01 ENCOUNTER — Encounter: Payer: BC Managed Care – PPO | Admitting: Plastic Surgery

## 2022-07-04 ENCOUNTER — Encounter: Payer: BC Managed Care – PPO | Admitting: Plastic Surgery

## 2022-07-04 ENCOUNTER — Ambulatory Visit (INDEPENDENT_AMBULATORY_CARE_PROVIDER_SITE_OTHER): Payer: BC Managed Care – PPO | Admitting: Student

## 2022-07-04 DIAGNOSIS — D170 Benign lipomatous neoplasm of skin and subcutaneous tissue of head, face and neck: Secondary | ICD-10-CM

## 2022-07-04 NOTE — Progress Notes (Signed)
Patient is a 46 year old female who underwent excision of subfascial scalp mass to the right scalp and complex closure of the right scalp with Dr. Erin Hearing on 06/20/22. Intraoperatively, interrupted 3-0 vicryl sutures were used to close the deep tissue followed by staples for the skin. She is 2 weeks postop. Patient presents to the clinic today for postoperative follow up.   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  She denies any issues or concerns she denies any fevers or chills, nausea or vomiting.  She denies any issues at the surgical site.  She denies any swelling, redness or drainage from the surgical site.  Surgical pathology was discussed with the patient.  On exam, patient is sitting upright in no acute distress.  Incision appears to be intact with staples.  There is a little bit of scabbing noted at the inferior aspect of the incision.  There is no surrounding swelling, erythema or drainage.  Staples were removed.  Patient tolerated well.  I discussed with the patient that she may apply a little bit of Vaseline over the incision daily. I discussed with the patient that she may shower and allow soap and water to run over the area. I also recommended the patient avoid exposing the incision to direct sunlight as this can worsen the scar. Patient expressed understanding.   I instructed the patient to call if she has any questions or concerns.  Patient to follow-up as needed.

## 2022-07-05 DIAGNOSIS — J301 Allergic rhinitis due to pollen: Secondary | ICD-10-CM | POA: Diagnosis not present

## 2022-07-05 DIAGNOSIS — J3089 Other allergic rhinitis: Secondary | ICD-10-CM | POA: Diagnosis not present

## 2022-07-05 DIAGNOSIS — J3081 Allergic rhinitis due to animal (cat) (dog) hair and dander: Secondary | ICD-10-CM | POA: Diagnosis not present

## 2022-07-14 DIAGNOSIS — J3081 Allergic rhinitis due to animal (cat) (dog) hair and dander: Secondary | ICD-10-CM | POA: Diagnosis not present

## 2022-07-14 DIAGNOSIS — J301 Allergic rhinitis due to pollen: Secondary | ICD-10-CM | POA: Diagnosis not present

## 2022-07-14 DIAGNOSIS — J3089 Other allergic rhinitis: Secondary | ICD-10-CM | POA: Diagnosis not present

## 2022-07-21 DIAGNOSIS — J3081 Allergic rhinitis due to animal (cat) (dog) hair and dander: Secondary | ICD-10-CM | POA: Diagnosis not present

## 2022-07-21 DIAGNOSIS — J301 Allergic rhinitis due to pollen: Secondary | ICD-10-CM | POA: Diagnosis not present

## 2022-07-21 DIAGNOSIS — J3089 Other allergic rhinitis: Secondary | ICD-10-CM | POA: Diagnosis not present

## 2022-07-28 DIAGNOSIS — J3089 Other allergic rhinitis: Secondary | ICD-10-CM | POA: Diagnosis not present

## 2022-07-28 DIAGNOSIS — J301 Allergic rhinitis due to pollen: Secondary | ICD-10-CM | POA: Diagnosis not present

## 2022-07-28 DIAGNOSIS — J3081 Allergic rhinitis due to animal (cat) (dog) hair and dander: Secondary | ICD-10-CM | POA: Diagnosis not present

## 2022-08-04 DIAGNOSIS — J301 Allergic rhinitis due to pollen: Secondary | ICD-10-CM | POA: Diagnosis not present

## 2022-08-04 DIAGNOSIS — J3089 Other allergic rhinitis: Secondary | ICD-10-CM | POA: Diagnosis not present

## 2022-08-04 DIAGNOSIS — J3081 Allergic rhinitis due to animal (cat) (dog) hair and dander: Secondary | ICD-10-CM | POA: Diagnosis not present

## 2022-08-11 DIAGNOSIS — J3089 Other allergic rhinitis: Secondary | ICD-10-CM | POA: Diagnosis not present

## 2022-08-11 DIAGNOSIS — J301 Allergic rhinitis due to pollen: Secondary | ICD-10-CM | POA: Diagnosis not present

## 2022-08-11 DIAGNOSIS — J3081 Allergic rhinitis due to animal (cat) (dog) hair and dander: Secondary | ICD-10-CM | POA: Diagnosis not present

## 2022-08-25 DIAGNOSIS — J301 Allergic rhinitis due to pollen: Secondary | ICD-10-CM | POA: Diagnosis not present

## 2022-08-25 DIAGNOSIS — J3089 Other allergic rhinitis: Secondary | ICD-10-CM | POA: Diagnosis not present

## 2022-08-25 DIAGNOSIS — J3081 Allergic rhinitis due to animal (cat) (dog) hair and dander: Secondary | ICD-10-CM | POA: Diagnosis not present

## 2022-09-01 DIAGNOSIS — J3089 Other allergic rhinitis: Secondary | ICD-10-CM | POA: Diagnosis not present

## 2022-09-01 DIAGNOSIS — Z79899 Other long term (current) drug therapy: Secondary | ICD-10-CM | POA: Diagnosis not present

## 2022-09-01 DIAGNOSIS — J301 Allergic rhinitis due to pollen: Secondary | ICD-10-CM | POA: Diagnosis not present

## 2022-09-01 DIAGNOSIS — J45909 Unspecified asthma, uncomplicated: Secondary | ICD-10-CM | POA: Diagnosis not present

## 2022-09-01 DIAGNOSIS — J3081 Allergic rhinitis due to animal (cat) (dog) hair and dander: Secondary | ICD-10-CM | POA: Diagnosis not present

## 2022-09-01 DIAGNOSIS — L209 Atopic dermatitis, unspecified: Secondary | ICD-10-CM | POA: Diagnosis not present

## 2022-09-08 DIAGNOSIS — J3081 Allergic rhinitis due to animal (cat) (dog) hair and dander: Secondary | ICD-10-CM | POA: Diagnosis not present

## 2022-09-08 DIAGNOSIS — J301 Allergic rhinitis due to pollen: Secondary | ICD-10-CM | POA: Diagnosis not present

## 2022-09-08 DIAGNOSIS — J3089 Other allergic rhinitis: Secondary | ICD-10-CM | POA: Diagnosis not present

## 2022-09-15 DIAGNOSIS — J3089 Other allergic rhinitis: Secondary | ICD-10-CM | POA: Diagnosis not present

## 2022-09-15 DIAGNOSIS — J3081 Allergic rhinitis due to animal (cat) (dog) hair and dander: Secondary | ICD-10-CM | POA: Diagnosis not present

## 2022-09-15 DIAGNOSIS — J301 Allergic rhinitis due to pollen: Secondary | ICD-10-CM | POA: Diagnosis not present

## 2022-09-22 DIAGNOSIS — J3081 Allergic rhinitis due to animal (cat) (dog) hair and dander: Secondary | ICD-10-CM | POA: Diagnosis not present

## 2022-09-22 DIAGNOSIS — J3089 Other allergic rhinitis: Secondary | ICD-10-CM | POA: Diagnosis not present

## 2022-09-22 DIAGNOSIS — J301 Allergic rhinitis due to pollen: Secondary | ICD-10-CM | POA: Diagnosis not present

## 2022-09-29 DIAGNOSIS — J3081 Allergic rhinitis due to animal (cat) (dog) hair and dander: Secondary | ICD-10-CM | POA: Diagnosis not present

## 2022-09-29 DIAGNOSIS — J3089 Other allergic rhinitis: Secondary | ICD-10-CM | POA: Diagnosis not present

## 2022-09-29 DIAGNOSIS — J301 Allergic rhinitis due to pollen: Secondary | ICD-10-CM | POA: Diagnosis not present

## 2022-10-06 DIAGNOSIS — J3089 Other allergic rhinitis: Secondary | ICD-10-CM | POA: Diagnosis not present

## 2022-10-06 DIAGNOSIS — J301 Allergic rhinitis due to pollen: Secondary | ICD-10-CM | POA: Diagnosis not present

## 2022-10-06 DIAGNOSIS — J3081 Allergic rhinitis due to animal (cat) (dog) hair and dander: Secondary | ICD-10-CM | POA: Diagnosis not present

## 2022-10-13 DIAGNOSIS — J3081 Allergic rhinitis due to animal (cat) (dog) hair and dander: Secondary | ICD-10-CM | POA: Diagnosis not present

## 2022-10-13 DIAGNOSIS — J3089 Other allergic rhinitis: Secondary | ICD-10-CM | POA: Diagnosis not present

## 2022-10-13 DIAGNOSIS — J301 Allergic rhinitis due to pollen: Secondary | ICD-10-CM | POA: Diagnosis not present

## 2022-10-20 DIAGNOSIS — J3089 Other allergic rhinitis: Secondary | ICD-10-CM | POA: Diagnosis not present

## 2022-10-20 DIAGNOSIS — J301 Allergic rhinitis due to pollen: Secondary | ICD-10-CM | POA: Diagnosis not present

## 2022-10-20 DIAGNOSIS — J3081 Allergic rhinitis due to animal (cat) (dog) hair and dander: Secondary | ICD-10-CM | POA: Diagnosis not present

## 2022-10-27 DIAGNOSIS — J301 Allergic rhinitis due to pollen: Secondary | ICD-10-CM | POA: Diagnosis not present

## 2022-10-27 DIAGNOSIS — J3081 Allergic rhinitis due to animal (cat) (dog) hair and dander: Secondary | ICD-10-CM | POA: Diagnosis not present

## 2022-10-27 DIAGNOSIS — J3089 Other allergic rhinitis: Secondary | ICD-10-CM | POA: Diagnosis not present

## 2022-11-03 DIAGNOSIS — J3081 Allergic rhinitis due to animal (cat) (dog) hair and dander: Secondary | ICD-10-CM | POA: Diagnosis not present

## 2022-11-03 DIAGNOSIS — J3089 Other allergic rhinitis: Secondary | ICD-10-CM | POA: Diagnosis not present

## 2022-11-03 DIAGNOSIS — J301 Allergic rhinitis due to pollen: Secondary | ICD-10-CM | POA: Diagnosis not present

## 2022-11-04 DIAGNOSIS — J45909 Unspecified asthma, uncomplicated: Secondary | ICD-10-CM | POA: Diagnosis not present

## 2022-11-10 DIAGNOSIS — J3089 Other allergic rhinitis: Secondary | ICD-10-CM | POA: Diagnosis not present

## 2022-11-10 DIAGNOSIS — J301 Allergic rhinitis due to pollen: Secondary | ICD-10-CM | POA: Diagnosis not present

## 2022-11-10 DIAGNOSIS — J3081 Allergic rhinitis due to animal (cat) (dog) hair and dander: Secondary | ICD-10-CM | POA: Diagnosis not present

## 2022-11-11 DIAGNOSIS — H1045 Other chronic allergic conjunctivitis: Secondary | ICD-10-CM | POA: Diagnosis not present

## 2022-11-11 DIAGNOSIS — J4530 Mild persistent asthma, uncomplicated: Secondary | ICD-10-CM | POA: Diagnosis not present

## 2022-11-11 DIAGNOSIS — R21 Rash and other nonspecific skin eruption: Secondary | ICD-10-CM | POA: Diagnosis not present

## 2022-11-11 DIAGNOSIS — Z91018 Allergy to other foods: Secondary | ICD-10-CM | POA: Diagnosis not present

## 2022-11-11 IMAGING — MG MM DIGITAL SCREENING BILAT W/ TOMO AND CAD
8 series · 8 of 24 positions shown · non-contrast
Comparison: Previous exam(s).

CLINICAL DATA: Screening.

EXAM:
DIGITAL SCREENING BILATERAL MAMMOGRAM WITH TOMOSYNTHESIS AND CAD
TECHNIQUE: Bilateral screening digital craniocaudal and mediolateral oblique
mammograms were obtained. Bilateral screening digital breast
tomosynthesis was performed. The images were evaluated with
computer-aided detection.

[L MLO synth-2D]
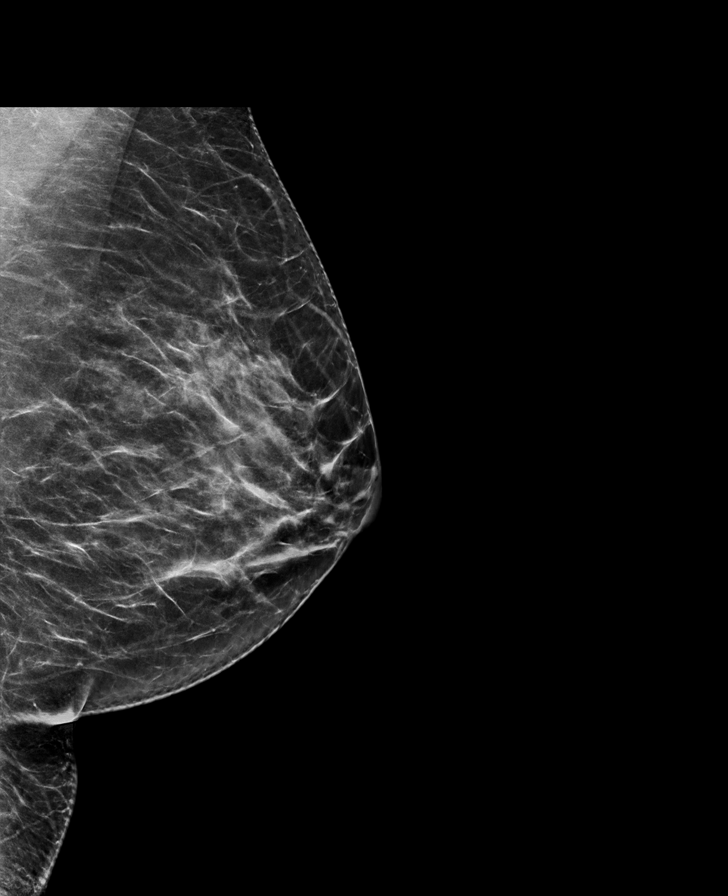

[R CC synth-2D]
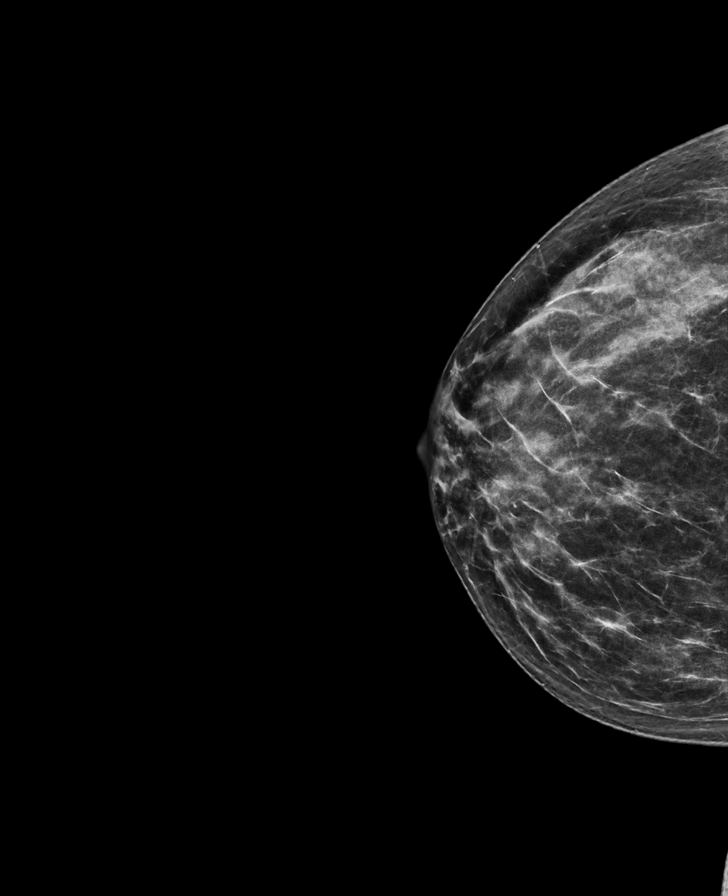

[R MLO synth-2D]
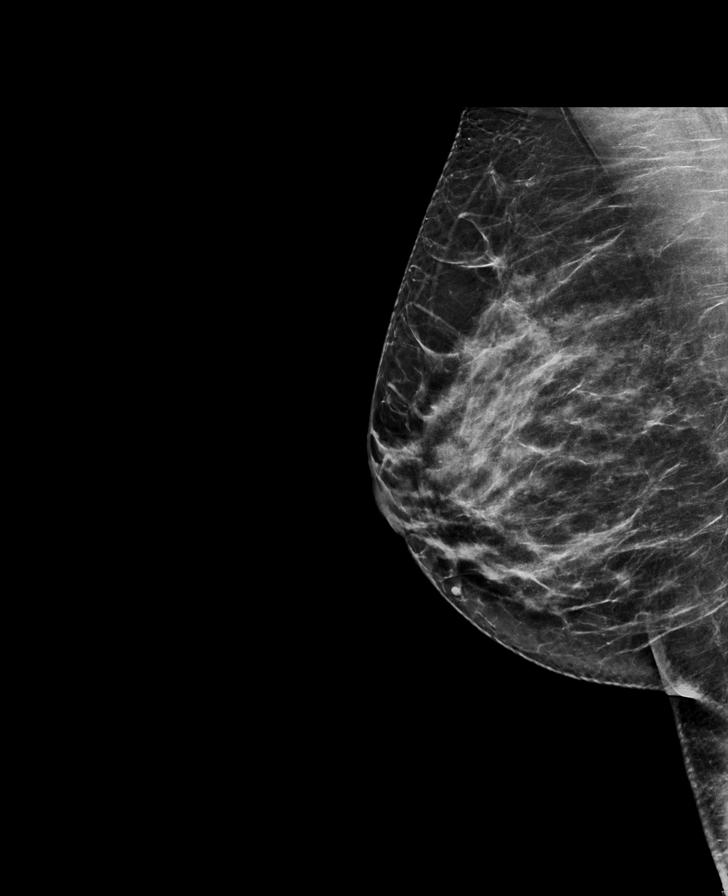

[L CC synth-2D]
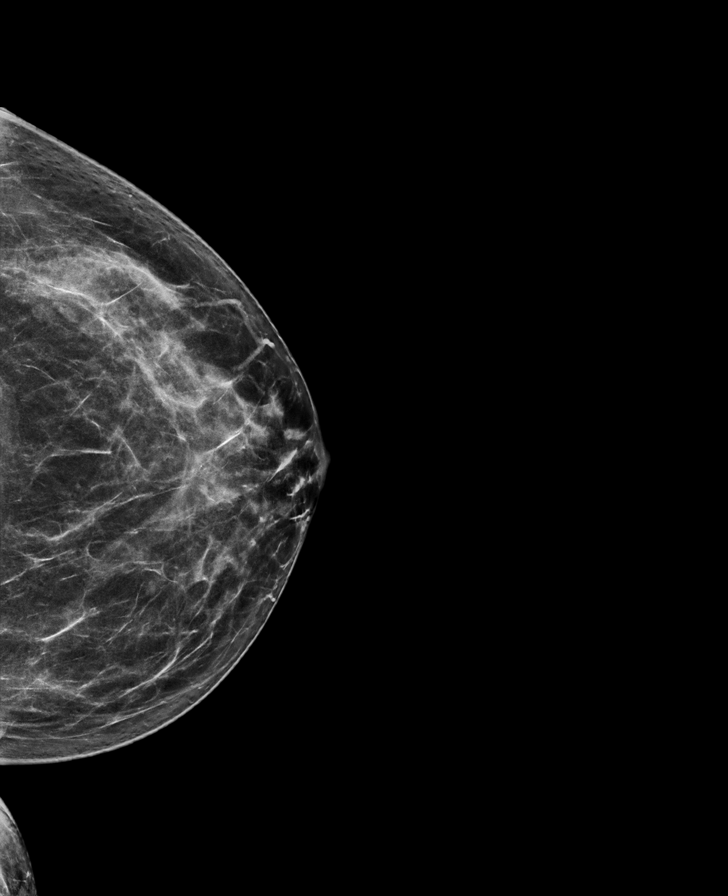

[R CC tomo · tomo slice 31/62.0]
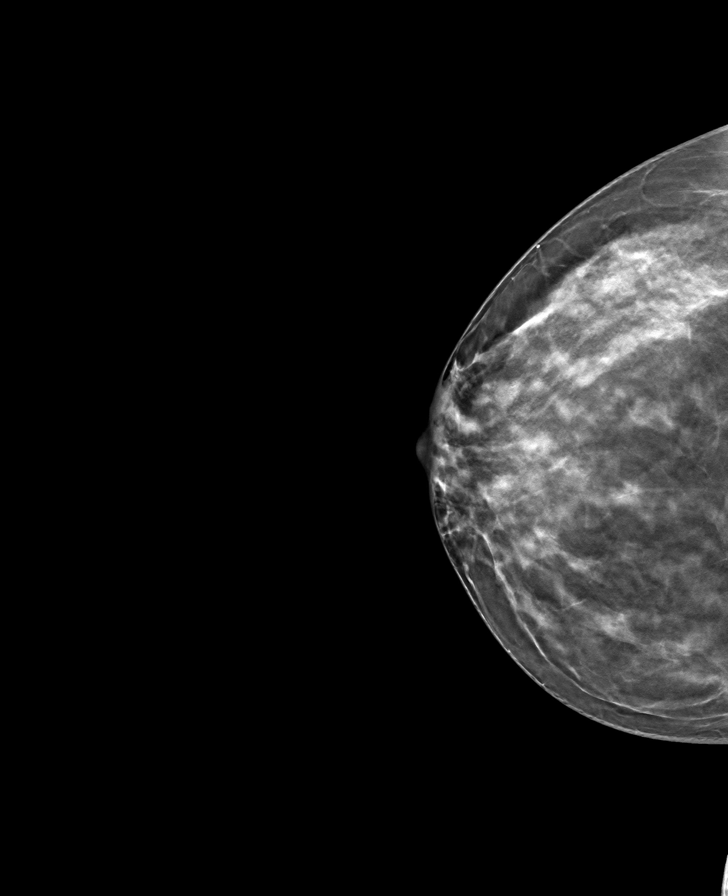

[L MLO tomo · tomo slice 35/68.0]
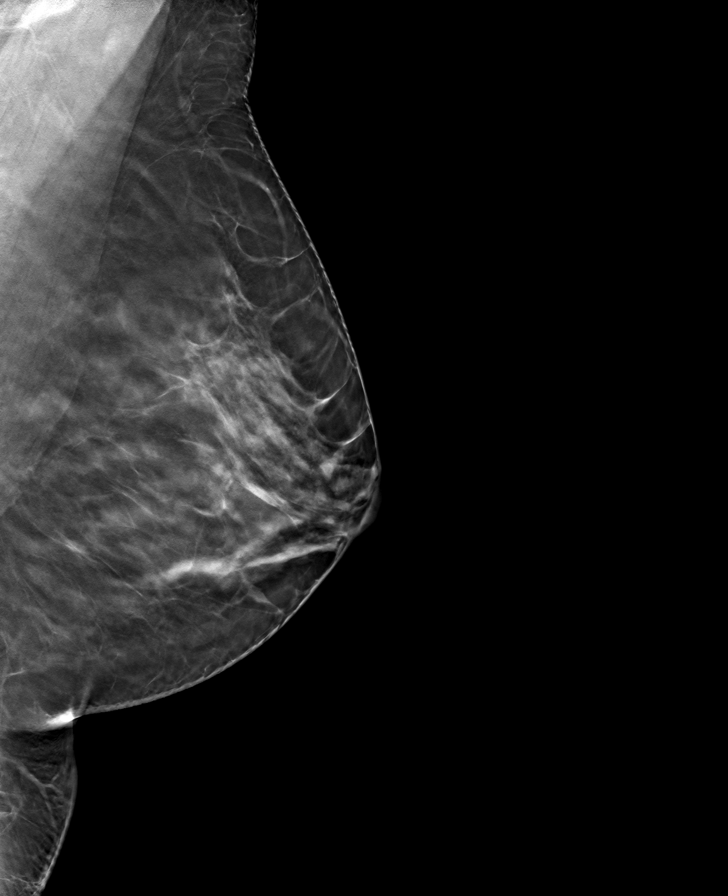

[L CC tomo · tomo slice 35/69.0]
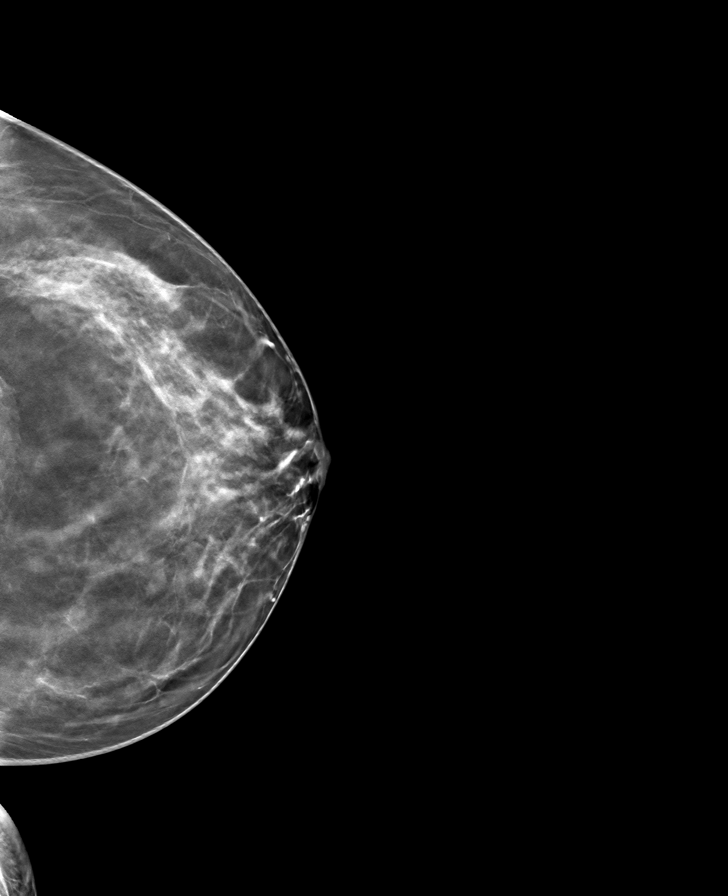

[R MLO tomo · tomo slice 35/68.0]
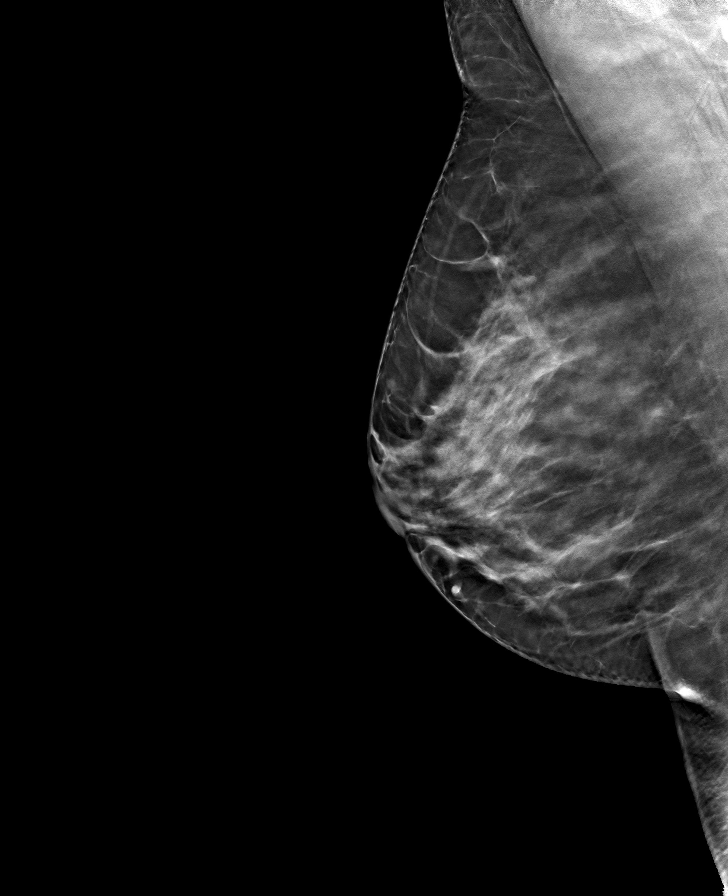

[8 of 24 positions shown; findings below may reference images not displayed]

ACR Breast Density Category c: The breast tissue is heterogeneously
dense, which may obscure small masses.
FINDINGS: There are no findings suspicious for malignancy.
IMPRESSION: No mammographic evidence of malignancy. A result letter of this
screening mammogram will be mailed directly to the patient.

RECOMMENDATION:
Screening mammogram in one year. (Code:[id number])

BI-RADS CATEGORY  1: Negative.

## 2022-11-17 DIAGNOSIS — J301 Allergic rhinitis due to pollen: Secondary | ICD-10-CM | POA: Diagnosis not present

## 2022-11-17 DIAGNOSIS — J3081 Allergic rhinitis due to animal (cat) (dog) hair and dander: Secondary | ICD-10-CM | POA: Diagnosis not present

## 2022-11-17 DIAGNOSIS — J3089 Other allergic rhinitis: Secondary | ICD-10-CM | POA: Diagnosis not present

## 2022-11-24 DIAGNOSIS — J301 Allergic rhinitis due to pollen: Secondary | ICD-10-CM | POA: Diagnosis not present

## 2022-11-24 DIAGNOSIS — J3081 Allergic rhinitis due to animal (cat) (dog) hair and dander: Secondary | ICD-10-CM | POA: Diagnosis not present

## 2022-11-24 DIAGNOSIS — J3089 Other allergic rhinitis: Secondary | ICD-10-CM | POA: Diagnosis not present

## 2022-11-30 DIAGNOSIS — J301 Allergic rhinitis due to pollen: Secondary | ICD-10-CM | POA: Diagnosis not present

## 2022-12-01 DIAGNOSIS — J301 Allergic rhinitis due to pollen: Secondary | ICD-10-CM | POA: Diagnosis not present

## 2022-12-01 DIAGNOSIS — J3081 Allergic rhinitis due to animal (cat) (dog) hair and dander: Secondary | ICD-10-CM | POA: Diagnosis not present

## 2022-12-01 DIAGNOSIS — J3089 Other allergic rhinitis: Secondary | ICD-10-CM | POA: Diagnosis not present

## 2022-12-08 DIAGNOSIS — J301 Allergic rhinitis due to pollen: Secondary | ICD-10-CM | POA: Diagnosis not present

## 2022-12-08 DIAGNOSIS — J3081 Allergic rhinitis due to animal (cat) (dog) hair and dander: Secondary | ICD-10-CM | POA: Diagnosis not present

## 2022-12-08 DIAGNOSIS — J3089 Other allergic rhinitis: Secondary | ICD-10-CM | POA: Diagnosis not present

## 2022-12-15 DIAGNOSIS — J301 Allergic rhinitis due to pollen: Secondary | ICD-10-CM | POA: Diagnosis not present

## 2022-12-15 DIAGNOSIS — J3081 Allergic rhinitis due to animal (cat) (dog) hair and dander: Secondary | ICD-10-CM | POA: Diagnosis not present

## 2022-12-15 DIAGNOSIS — J3089 Other allergic rhinitis: Secondary | ICD-10-CM | POA: Diagnosis not present

## 2022-12-22 DIAGNOSIS — J301 Allergic rhinitis due to pollen: Secondary | ICD-10-CM | POA: Diagnosis not present

## 2022-12-22 DIAGNOSIS — J3081 Allergic rhinitis due to animal (cat) (dog) hair and dander: Secondary | ICD-10-CM | POA: Diagnosis not present

## 2022-12-22 DIAGNOSIS — J3089 Other allergic rhinitis: Secondary | ICD-10-CM | POA: Diagnosis not present

## 2022-12-30 ENCOUNTER — Ambulatory Visit (HOSPITAL_COMMUNITY)
Admission: EM | Admit: 2022-12-30 | Discharge: 2022-12-30 | Disposition: A | Discharge status: Home / Self Care | Payer: BC Managed Care – PPO | Attending: Emergency Medicine | Admitting: Emergency Medicine

## 2022-12-30 ENCOUNTER — Encounter (HOSPITAL_COMMUNITY): Payer: Self-pay

## 2022-12-30 DIAGNOSIS — J4521 Mild intermittent asthma with (acute) exacerbation: Secondary | ICD-10-CM | POA: Diagnosis not present

## 2022-12-30 MED ORDER — PREDNISONE 20 MG PO TABS: 40.0000 mg | ORAL_TABLET | Freq: Every day | ORAL | 0 refills | Status: AC

## 2022-12-30 NOTE — Discharge Instructions (Addendum)
Please use your albuterol inhaler 3 times daily (every 6 hours) for the next several days. Then continue as needed.  Prednisone 40 mg daily for the next 5 days   Start once daily allergy medicine. Continue nasal spray.  Use throat lozenges, honey, and drink lots of fluids Prop up at night time. Run a humidifier in the room.  Please return or follow with your primary care for persisting symptoms.

## 2022-12-30 NOTE — ED Triage Notes (Signed)
Pt states cough and congestion for the past 2 weeks. States she has not been taking anything at home for her symptoms.

## 2022-12-30 NOTE — ED Provider Notes (Signed)
[facility]    CSN: [id number] Arrival date & time: 12/30/22  1157     History   Chief Complaint Chief Complaint  Patient presents with   Cough    HPI Alice Dominguez. Alice Dominguez is a 47 y.o. female.  Intermittent 2 week history of dry cough. Worse in morning and at night. Comes and goes. Denies nasal congestion or runny nose. No fever, sore throat, GI symptoms. No shortness of breath.  Has not taken any medicines.  Hx asthma and allergies Uses pulmicort BID  Past Medical History:  Diagnosis Date   Asthma    Seasonal allergies     There are no problems to display for this patient.   Past Surgical History:  Procedure Laterality Date   DILATION AND EVACUATION     miscarriage   EXCISION MASS HEAD Right 06/20/2022   Procedure: EXCISION SUBFASCIAL MASS HEAD RIGHT SCALP;  Surgeon: Janne Napoleon, MD;  Location: [facility];  Service: Plastics;  Laterality: Right;  1 hour   TONSILLECTOMY      OB History   No obstetric history on file.      Home Medications    Prior to Admission medications   Medication Sig Start Date End Date Taking? Authorizing Provider  predniSONE (DELTASONE) 20 MG tablet Take 2 tablets (40 mg total) by mouth daily with breakfast for 5 days. 12/30/22 01/04/23 Yes Rhea Kaelin, Lurena Joiner, PA-C  betamethasone dipropionate 0.05 % cream Apply topically 2 (two) times daily. 01/11/22   [provider]  budesonide (PULMICORT FLEXHALER) 180 MCG/ACT inhaler 1 puff 02/06/18   [provider]  EPINEPHrine 0.3 mg/0.3 mL IJ SOAJ injection Inject into the muscle. 11/08/21   [provider]  fluticasone Aleda Grana) 50 MCG/ACT nasal spray SMARTSIG:1-2 Spray(s) Both Nares Daily 11/08/21   [provider]  hydrocortisone cream 0.5 % Apply 1 application topically 2 (two) times daily. 12/23/20   Raspet, Noberto Retort, PA-C  Olopatadine HCl (PATADAY) 0.7 % SOLN 1 drop into affected eye    [provider]    Family  History Family History  Problem Relation Age of Onset   Breast cancer Maternal Grandmother    Healthy Mother    Healthy Father     Social History Social History   Tobacco Use   Smoking status: Never   Smokeless tobacco: Never  Vaping Use   Vaping Use: Never used  Substance Use Topics   Alcohol use: Never   Drug use: Never     Allergies   Barley green [barley grass], Cat hair extract, Grass pollen([id number] vern), Other, Molds & smuts, and Penicillins   Review of Systems Review of Systems As per HPI  Physical Exam Triage Vital Signs ED Triage Vitals  Enc Vitals Group     BP 12/30/22 1211 138/76     Pulse Rate 12/30/22 1211 64     Resp 12/30/22 1211 16     Temp 12/30/22 1211 97.8 F (36.6 C)     Temp Source 12/30/22 1211 Oral     SpO2 12/30/22 1211 98 %     Weight --      Height --      Head Circumference --      Peak Flow --      Pain Score 12/30/22 1213 0     Pain Loc --      Pain Edu? --      Excl. in GC? --    No data found.  Updated Vital Signs  BP 138/76 (BP Location: Right Arm)   Pulse 64   Temp 97.8 F (36.6 C) (Oral)   Resp 16   LMP 12/02/2022 (Approximate)   SpO2 98%     Physical Exam Vitals and nursing note reviewed.  Constitutional:      General: She is not in acute distress.    Appearance: She is not ill-appearing.     Comments: Speaks in full sentences. Well appearing   HENT:     Right Ear: Tympanic membrane and ear canal normal.     Left Ear: Tympanic membrane and ear canal normal.     Nose: No congestion or rhinorrhea.     Mouth/Throat:     Mouth: Mucous membranes are moist.     Pharynx: Oropharynx is clear. No posterior oropharyngeal erythema.  Eyes:     Conjunctiva/sclera: Conjunctivae normal.  Cardiovascular:     Rate and Rhythm: Normal rate and regular rhythm.     Pulses: Normal pulses.     Heart sounds: Normal heart sounds.  Pulmonary:     Effort: No respiratory distress.     Breath sounds: Normal breath sounds. No  wheezing or rhonchi.     Comments: Clear throughout  Musculoskeletal:     Cervical back: Normal range of motion.  Lymphadenopathy:     Cervical: No cervical adenopathy.  Skin:    General: Skin is warm and dry.  Neurological:     Mental Status: She is alert and oriented to person, place, and time.     UC Treatments / Results  Labs (all labs ordered are listed, but only abnormal results are displayed) Labs Reviewed - No data to display  EKG  Radiology No results found.  Procedures Procedures  Medications Ordered in UC Medications - No data to display  Initial Impression / Assessment and Plan / UC Course  I have reviewed the triage vital signs and the nursing notes.  Pertinent labs & imaging results that were available during my care of the patient were reviewed by me and considered in my medical decision making (see chart for details).  Afebrile, well appearing, clear lungs. No respiratory distress.  Suspect allergies with worsening of asthma/mild exacerbation  Prednisone 40 mg daily x 5 days Advised to use albuterol inhaler q6 hours Start daily allergy med Other symptomatic care at home. Return or follow with PCP for persisting symptoms  Final Clinical Impressions(s) / UC Diagnoses   Final diagnoses:  Mild intermittent asthma with exacerbation     Discharge Instructions      Please use your albuterol inhaler 3 times daily (every 6 hours) for the next several days. Then continue as needed.  Prednisone 40 mg daily for the next 5 days   Start once daily allergy medicine. Continue nasal spray.  Use throat lozenges, honey, and drink lots of fluids Prop up at night time. Run a humidifier in the room.  Please return or follow with your primary care for persisting symptoms.      ED Prescriptions     Medication Sig Dispense Auth. Provider   predniSONE (DELTASONE) 20 MG tablet Take 2 tablets (40 mg total) by mouth daily with breakfast for 5 days. 10 tablet  Haruto Demaria, Lurena Joinerebecca, PA-C      PDMP not reviewed this encounter.   Lorena Clearman, Lurena JoinerRebecca, [place] 12/30/22 1352

## 2023-01-04 ENCOUNTER — Other Ambulatory Visit: Payer: Self-pay | Admitting: Nurse Practitioner

## 2023-01-04 DIAGNOSIS — Z1231 Encounter for screening mammogram for malignant neoplasm of breast: Secondary | ICD-10-CM

## 2023-01-05 DIAGNOSIS — J3081 Allergic rhinitis due to animal (cat) (dog) hair and dander: Secondary | ICD-10-CM | POA: Diagnosis not present

## 2023-01-05 DIAGNOSIS — J3089 Other allergic rhinitis: Secondary | ICD-10-CM | POA: Diagnosis not present

## 2023-01-05 DIAGNOSIS — J301 Allergic rhinitis due to pollen: Secondary | ICD-10-CM | POA: Diagnosis not present

## 2023-01-12 DIAGNOSIS — J3081 Allergic rhinitis due to animal (cat) (dog) hair and dander: Secondary | ICD-10-CM | POA: Diagnosis not present

## 2023-01-12 DIAGNOSIS — J301 Allergic rhinitis due to pollen: Secondary | ICD-10-CM | POA: Diagnosis not present

## 2023-01-12 DIAGNOSIS — J3089 Other allergic rhinitis: Secondary | ICD-10-CM | POA: Diagnosis not present

## 2023-01-19 DIAGNOSIS — J301 Allergic rhinitis due to pollen: Secondary | ICD-10-CM | POA: Diagnosis not present

## 2023-01-19 DIAGNOSIS — J3081 Allergic rhinitis due to animal (cat) (dog) hair and dander: Secondary | ICD-10-CM | POA: Diagnosis not present

## 2023-01-19 DIAGNOSIS — J3089 Other allergic rhinitis: Secondary | ICD-10-CM | POA: Diagnosis not present

## 2023-01-26 DIAGNOSIS — J301 Allergic rhinitis due to pollen: Secondary | ICD-10-CM | POA: Diagnosis not present

## 2023-01-26 DIAGNOSIS — J3081 Allergic rhinitis due to animal (cat) (dog) hair and dander: Secondary | ICD-10-CM | POA: Diagnosis not present

## 2023-01-26 DIAGNOSIS — J3089 Other allergic rhinitis: Secondary | ICD-10-CM | POA: Diagnosis not present

## 2023-02-02 DIAGNOSIS — J301 Allergic rhinitis due to pollen: Secondary | ICD-10-CM | POA: Diagnosis not present

## 2023-02-02 DIAGNOSIS — J3081 Allergic rhinitis due to animal (cat) (dog) hair and dander: Secondary | ICD-10-CM | POA: Diagnosis not present

## 2023-02-02 DIAGNOSIS — J3089 Other allergic rhinitis: Secondary | ICD-10-CM | POA: Diagnosis not present

## 2023-02-09 DIAGNOSIS — J3089 Other allergic rhinitis: Secondary | ICD-10-CM | POA: Diagnosis not present

## 2023-02-09 DIAGNOSIS — J3081 Allergic rhinitis due to animal (cat) (dog) hair and dander: Secondary | ICD-10-CM | POA: Diagnosis not present

## 2023-02-09 DIAGNOSIS — J301 Allergic rhinitis due to pollen: Secondary | ICD-10-CM | POA: Diagnosis not present

## 2023-02-16 ENCOUNTER — Ambulatory Visit
Admission: RE | Admit: 2023-02-16 | Discharge: 2023-02-16 | Disposition: A | Discharge status: Home / Self Care | Payer: BC Managed Care – PPO | Source: Ambulatory Visit | Attending: Nurse Practitioner | Admitting: Nurse Practitioner

## 2023-02-16 DIAGNOSIS — J301 Allergic rhinitis due to pollen: Secondary | ICD-10-CM | POA: Diagnosis not present

## 2023-02-16 DIAGNOSIS — J3089 Other allergic rhinitis: Secondary | ICD-10-CM | POA: Diagnosis not present

## 2023-02-16 DIAGNOSIS — Z1231 Encounter for screening mammogram for malignant neoplasm of breast: Secondary | ICD-10-CM

## 2023-02-16 DIAGNOSIS — J3081 Allergic rhinitis due to animal (cat) (dog) hair and dander: Secondary | ICD-10-CM | POA: Diagnosis not present

## 2023-02-23 DIAGNOSIS — J3081 Allergic rhinitis due to animal (cat) (dog) hair and dander: Secondary | ICD-10-CM | POA: Diagnosis not present

## 2023-02-23 DIAGNOSIS — J3089 Other allergic rhinitis: Secondary | ICD-10-CM | POA: Diagnosis not present

## 2023-02-23 DIAGNOSIS — J301 Allergic rhinitis due to pollen: Secondary | ICD-10-CM | POA: Diagnosis not present

## 2023-03-02 DIAGNOSIS — J301 Allergic rhinitis due to pollen: Secondary | ICD-10-CM | POA: Diagnosis not present

## 2023-03-02 DIAGNOSIS — J3081 Allergic rhinitis due to animal (cat) (dog) hair and dander: Secondary | ICD-10-CM | POA: Diagnosis not present

## 2023-03-02 DIAGNOSIS — J3089 Other allergic rhinitis: Secondary | ICD-10-CM | POA: Diagnosis not present

## 2023-03-09 DIAGNOSIS — J3089 Other allergic rhinitis: Secondary | ICD-10-CM | POA: Diagnosis not present

## 2023-03-09 DIAGNOSIS — J301 Allergic rhinitis due to pollen: Secondary | ICD-10-CM | POA: Diagnosis not present

## 2023-03-09 DIAGNOSIS — J3081 Allergic rhinitis due to animal (cat) (dog) hair and dander: Secondary | ICD-10-CM | POA: Diagnosis not present

## 2023-03-15 DIAGNOSIS — Z01419 Encounter for gynecological examination (general) (routine) without abnormal findings: Secondary | ICD-10-CM | POA: Diagnosis not present

## 2023-03-15 DIAGNOSIS — Z124 Encounter for screening for malignant neoplasm of cervix: Secondary | ICD-10-CM | POA: Diagnosis not present

## 2023-03-16 DIAGNOSIS — J3081 Allergic rhinitis due to animal (cat) (dog) hair and dander: Secondary | ICD-10-CM | POA: Diagnosis not present

## 2023-03-16 DIAGNOSIS — J3089 Other allergic rhinitis: Secondary | ICD-10-CM | POA: Diagnosis not present

## 2023-03-16 DIAGNOSIS — J301 Allergic rhinitis due to pollen: Secondary | ICD-10-CM | POA: Diagnosis not present

## 2023-04-14 DIAGNOSIS — J3081 Allergic rhinitis due to animal (cat) (dog) hair and dander: Secondary | ICD-10-CM | POA: Diagnosis not present

## 2023-04-14 DIAGNOSIS — J301 Allergic rhinitis due to pollen: Secondary | ICD-10-CM | POA: Diagnosis not present

## 2023-04-14 DIAGNOSIS — J3089 Other allergic rhinitis: Secondary | ICD-10-CM | POA: Diagnosis not present

## 2023-04-20 DIAGNOSIS — J3089 Other allergic rhinitis: Secondary | ICD-10-CM | POA: Diagnosis not present

## 2023-04-20 DIAGNOSIS — J3081 Allergic rhinitis due to animal (cat) (dog) hair and dander: Secondary | ICD-10-CM | POA: Diagnosis not present

## 2023-04-20 DIAGNOSIS — J301 Allergic rhinitis due to pollen: Secondary | ICD-10-CM | POA: Diagnosis not present

## 2023-05-05 DIAGNOSIS — J301 Allergic rhinitis due to pollen: Secondary | ICD-10-CM | POA: Diagnosis not present

## 2023-05-05 DIAGNOSIS — J3089 Other allergic rhinitis: Secondary | ICD-10-CM | POA: Diagnosis not present

## 2023-05-05 DIAGNOSIS — J3081 Allergic rhinitis due to animal (cat) (dog) hair and dander: Secondary | ICD-10-CM | POA: Diagnosis not present

## 2023-05-11 DIAGNOSIS — J3089 Other allergic rhinitis: Secondary | ICD-10-CM | POA: Diagnosis not present

## 2023-05-11 DIAGNOSIS — J3081 Allergic rhinitis due to animal (cat) (dog) hair and dander: Secondary | ICD-10-CM | POA: Diagnosis not present

## 2023-05-11 DIAGNOSIS — J301 Allergic rhinitis due to pollen: Secondary | ICD-10-CM | POA: Diagnosis not present

## 2023-05-16 DIAGNOSIS — N840 Polyp of corpus uteri: Secondary | ICD-10-CM | POA: Diagnosis not present

## 2023-05-18 DIAGNOSIS — J3081 Allergic rhinitis due to animal (cat) (dog) hair and dander: Secondary | ICD-10-CM | POA: Diagnosis not present

## 2023-05-18 DIAGNOSIS — J301 Allergic rhinitis due to pollen: Secondary | ICD-10-CM | POA: Diagnosis not present

## 2023-05-18 DIAGNOSIS — J3089 Other allergic rhinitis: Secondary | ICD-10-CM | POA: Diagnosis not present

## 2023-05-22 DIAGNOSIS — N840 Polyp of corpus uteri: Secondary | ICD-10-CM | POA: Diagnosis not present

## 2023-05-25 DIAGNOSIS — J301 Allergic rhinitis due to pollen: Secondary | ICD-10-CM | POA: Diagnosis not present

## 2023-05-25 DIAGNOSIS — J3081 Allergic rhinitis due to animal (cat) (dog) hair and dander: Secondary | ICD-10-CM | POA: Diagnosis not present

## 2023-05-25 DIAGNOSIS — J3089 Other allergic rhinitis: Secondary | ICD-10-CM | POA: Diagnosis not present

## 2023-06-01 DIAGNOSIS — J3081 Allergic rhinitis due to animal (cat) (dog) hair and dander: Secondary | ICD-10-CM | POA: Diagnosis not present

## 2023-06-01 DIAGNOSIS — J301 Allergic rhinitis due to pollen: Secondary | ICD-10-CM | POA: Diagnosis not present

## 2023-06-01 DIAGNOSIS — J3089 Other allergic rhinitis: Secondary | ICD-10-CM | POA: Diagnosis not present

## 2023-06-08 DIAGNOSIS — J3081 Allergic rhinitis due to animal (cat) (dog) hair and dander: Secondary | ICD-10-CM | POA: Diagnosis not present

## 2023-06-08 DIAGNOSIS — J301 Allergic rhinitis due to pollen: Secondary | ICD-10-CM | POA: Diagnosis not present

## 2023-06-08 DIAGNOSIS — J3089 Other allergic rhinitis: Secondary | ICD-10-CM | POA: Diagnosis not present

## 2023-06-15 DIAGNOSIS — J3081 Allergic rhinitis due to animal (cat) (dog) hair and dander: Secondary | ICD-10-CM | POA: Diagnosis not present

## 2023-06-15 DIAGNOSIS — J3089 Other allergic rhinitis: Secondary | ICD-10-CM | POA: Diagnosis not present

## 2023-06-15 DIAGNOSIS — J301 Allergic rhinitis due to pollen: Secondary | ICD-10-CM | POA: Diagnosis not present

## 2023-06-22 DIAGNOSIS — J3089 Other allergic rhinitis: Secondary | ICD-10-CM | POA: Diagnosis not present

## 2023-06-22 DIAGNOSIS — J301 Allergic rhinitis due to pollen: Secondary | ICD-10-CM | POA: Diagnosis not present

## 2023-06-22 DIAGNOSIS — J3081 Allergic rhinitis due to animal (cat) (dog) hair and dander: Secondary | ICD-10-CM | POA: Diagnosis not present

## 2023-06-29 DIAGNOSIS — J301 Allergic rhinitis due to pollen: Secondary | ICD-10-CM | POA: Diagnosis not present

## 2023-06-29 DIAGNOSIS — J3089 Other allergic rhinitis: Secondary | ICD-10-CM | POA: Diagnosis not present

## 2023-06-29 DIAGNOSIS — J3081 Allergic rhinitis due to animal (cat) (dog) hair and dander: Secondary | ICD-10-CM | POA: Diagnosis not present

## 2023-07-06 DIAGNOSIS — J3089 Other allergic rhinitis: Secondary | ICD-10-CM | POA: Diagnosis not present

## 2023-07-06 DIAGNOSIS — J301 Allergic rhinitis due to pollen: Secondary | ICD-10-CM | POA: Diagnosis not present

## 2023-07-06 DIAGNOSIS — J3081 Allergic rhinitis due to animal (cat) (dog) hair and dander: Secondary | ICD-10-CM | POA: Diagnosis not present

## 2023-07-13 DIAGNOSIS — J3089 Other allergic rhinitis: Secondary | ICD-10-CM | POA: Diagnosis not present

## 2023-07-13 DIAGNOSIS — J3081 Allergic rhinitis due to animal (cat) (dog) hair and dander: Secondary | ICD-10-CM | POA: Diagnosis not present

## 2023-07-13 DIAGNOSIS — J301 Allergic rhinitis due to pollen: Secondary | ICD-10-CM | POA: Diagnosis not present

## 2023-07-20 DIAGNOSIS — J3089 Other allergic rhinitis: Secondary | ICD-10-CM | POA: Diagnosis not present

## 2023-07-20 DIAGNOSIS — J301 Allergic rhinitis due to pollen: Secondary | ICD-10-CM | POA: Diagnosis not present

## 2023-07-20 DIAGNOSIS — J3081 Allergic rhinitis due to animal (cat) (dog) hair and dander: Secondary | ICD-10-CM | POA: Diagnosis not present

## 2023-07-21 DIAGNOSIS — J3089 Other allergic rhinitis: Secondary | ICD-10-CM | POA: Diagnosis not present

## 2023-07-21 DIAGNOSIS — J3081 Allergic rhinitis due to animal (cat) (dog) hair and dander: Secondary | ICD-10-CM | POA: Diagnosis not present

## 2023-07-27 DIAGNOSIS — J3089 Other allergic rhinitis: Secondary | ICD-10-CM | POA: Diagnosis not present

## 2023-07-27 DIAGNOSIS — J3081 Allergic rhinitis due to animal (cat) (dog) hair and dander: Secondary | ICD-10-CM | POA: Diagnosis not present

## 2023-07-27 DIAGNOSIS — J301 Allergic rhinitis due to pollen: Secondary | ICD-10-CM | POA: Diagnosis not present

## 2023-08-06 ENCOUNTER — Encounter (HOSPITAL_COMMUNITY): Payer: Self-pay

## 2023-08-06 ENCOUNTER — Ambulatory Visit (HOSPITAL_COMMUNITY)
Admission: EM | Admit: 2023-08-06 | Discharge: 2023-08-06 | Disposition: A | Discharge status: Home / Self Care | Payer: BC Managed Care – PPO

## 2023-08-06 DIAGNOSIS — J069 Acute upper respiratory infection, unspecified: Secondary | ICD-10-CM | POA: Diagnosis not present

## 2023-08-06 MED ORDER — PROMETHAZINE-DM 6.25-15 MG/5ML PO SYRP: 5.0000 mL | ORAL_SOLUTION | Freq: Four times a day (QID) | ORAL | 0 refills | Status: AC | PRN

## 2023-08-06 MED ORDER — ONDANSETRON 4 MG PO TBDP: 4.0000 mg | ORAL_TABLET | Freq: Three times a day (TID) | ORAL | 0 refills | Status: AC | PRN

## 2023-08-06 MED ORDER — DEXAMETHASONE SODIUM PHOSPHATE 10 MG/ML IJ SOLN
10.0000 mg | Freq: Once | INTRAMUSCULAR | Status: AC
  Administered 2023-08-06: 10 mg via INTRAMUSCULAR

## 2023-08-06 MED ORDER — DEXAMETHASONE SODIUM PHOSPHATE 10 MG/ML IJ SOLN
INTRAMUSCULAR | Status: AC
  Filled 2023-08-06: qty 1

## 2023-08-06 NOTE — ED Provider Notes (Signed)
[facility]    CSN: [id number] Arrival date & time: 08/06/23  1121      History   Chief Complaint Chief Complaint  Patient presents with   Cough    Cough and fever    HPI Alice Dominguez is a 47 y.o. female.   47 year old female who presents to urgent care with complaints of cough and fever for about 1 week. Temperature as been around 100. Having issues with hot and cold. Also has sore throat from cough. Does have asthma as well and it makes it worse. Using albuterol but doesn't really help much. Has been using mucinex day/night and a OTC cough medication. Denies nausea/vomiting/abdominal pain but has poor appetite. Drinking well and staying hydrated.    Cough Associated symptoms: chills, fever and sore throat   Associated symptoms: no chest pain, no ear pain, no rash and no shortness of breath     Past Medical History:  Diagnosis Date   Asthma    Seasonal allergies     There are no problems to display for this patient.   Past Surgical History:  Procedure Laterality Date   DILATION AND EVACUATION     miscarriage   EXCISION MASS HEAD Right 06/20/2022   Procedure: EXCISION SUBFASCIAL MASS HEAD RIGHT SCALP;  Surgeon: Janne Napoleon, MD;  Location: [facility];  Service: Plastics;  Laterality: Right;  1 hour   TONSILLECTOMY      OB History   No obstetric history on file.      Home Medications    Prior to Admission medications   Medication Sig Start Date End Date Taking? Authorizing Provider  Acetaminophen-guaiFENesin [facility] COLD & FLU PO) Take by mouth.   Yes [provider]  budesonide (PULMICORT FLEXHALER) 180 MCG/ACT inhaler 1 puff 02/06/18  Yes [provider]  EPINEPHrine 0.3 mg/0.3 mL IJ SOAJ injection Inject into the muscle. 11/08/21  Yes [provider]  Olopatadine HCl (PATADAY) 0.7 % SOLN 1 drop into affected eye   Yes [provider]  ondansetron (ZOFRAN-ODT) 4 MG  disintegrating tablet Take 1 tablet (4 mg total) by mouth every 8 (eight) hours as needed for nausea or vomiting. 08/06/23  Yes Dianne Bady A, PA-C  promethazine-dextromethorphan (PROMETHAZINE-DM) 6.25-15 MG/5ML syrup Take 5 mLs by mouth 4 (four) times daily as needed for cough. 08/06/23  Yes Edrei Norgaard A, PA-C  betamethasone dipropionate 0.05 % cream Apply topically 2 (two) times daily. 01/11/22   [provider]  fluticasone Aleda Grana) 50 MCG/ACT nasal spray SMARTSIG:1-2 Spray(s) Both Nares Daily 11/08/21   [provider]  hydrocortisone cream 0.5 % Apply 1 application topically 2 (two) times daily. 12/23/20   Raspet, Noberto Retort, PA-C    Family History Family History  Problem Relation Age of Onset   Breast cancer Maternal Grandmother    Healthy Mother    Healthy Father     Social History Social History   Tobacco Use   Smoking status: Never   Smokeless tobacco: Never  Vaping Use   Vaping status: Never Used  Substance Use Topics   Alcohol use: Never   Drug use: Never     Allergies   Barley green [barley grass], Cat hair extract, Grass pollen(k-o-r-t-swt vern), Other, Molds & smuts, and Penicillins   Review of Systems Review of Systems  Constitutional:  Positive for appetite change, chills and fever.  HENT:  Positive for sore throat. Negative for ear pain.   Eyes:  Negative for pain  and visual disturbance.  Respiratory:  Positive for cough. Negative for shortness of breath.   Cardiovascular:  Negative for chest pain and palpitations.  Gastrointestinal:  Negative for abdominal pain and vomiting.  Genitourinary:  Negative for dysuria and hematuria.  Musculoskeletal:  Negative for arthralgias and back pain.  Skin:  Negative for color change and rash.  Neurological:  Negative for seizures and syncope.  All other systems reviewed and are negative.    Physical Exam Triage Vital Signs ED Triage Vitals  Encounter Vitals Group     BP 08/06/23 1234  121/82     Systolic BP Percentile --      Diastolic BP Percentile --      Pulse Rate 08/06/23 1234 88     Resp 08/06/23 1234 18     Temp 08/06/23 1234 97.7 F (36.5 C)     Temp Source 08/06/23 1234 Oral     SpO2 08/06/23 1234 96 %     Weight 08/06/23 1231 180 lb (81.6 kg)     Height --      Head Circumference --      Peak Flow --      Pain Score 08/06/23 1231 0     Pain Loc --      Pain Education --      Exclude from Growth Chart --    No data found.  Updated Vital Signs BP 121/82 (BP Location: Left Arm)   Pulse 88   Temp 97.7 F (36.5 C) (Oral)   Resp 18   Wt 180 lb (81.6 kg)   LMP 07/22/2023   SpO2 96%   BMI 28.19 kg/m   Visual Acuity Right Eye Distance:   Left Eye Distance:   Bilateral Distance:    Right Eye Near:   Left Eye Near:    Bilateral Near:     Physical Exam Vitals and nursing note reviewed.  Constitutional:      General: She is not in acute distress.    Appearance: She is well-developed.  HENT:     Head: Normocephalic and atraumatic.     Right Ear: Tympanic membrane normal.     Left Ear: Tympanic membrane normal.     Nose: Congestion present.     Mouth/Throat:     Mouth: Mucous membranes are moist.     Pharynx: Posterior oropharyngeal erythema present. No oropharyngeal exudate.  Eyes:     Conjunctiva/sclera: Conjunctivae normal.  Cardiovascular:     Rate and Rhythm: Normal rate and regular rhythm.     Heart sounds: No murmur heard. Pulmonary:     Effort: Pulmonary effort is normal. No respiratory distress.     Breath sounds: Normal breath sounds.  Abdominal:     Palpations: Abdomen is soft.     Tenderness: There is no abdominal tenderness.  Musculoskeletal:        General: No swelling.     Cervical back: Neck supple.  Skin:    General: Skin is warm and dry.     Capillary Refill: Capillary refill takes less than 2 seconds.  Neurological:     Mental Status: She is alert.  Psychiatric:        Mood and Affect: Mood normal.       UC Treatments / Results  Labs (all labs ordered are listed, but only abnormal results are displayed) Labs Reviewed - No data to display  EKG   Radiology No results found.  Procedures Procedures (including critical care time)  Medications Ordered in  UC Medications  dexamethasone (DECADRON) injection 10 mg (has no administration in time range)    Initial Impression / Assessment and Plan / UC Course  I have reviewed the triage vital signs and the nursing notes.  Pertinent labs & imaging results that were available during my care of the patient were reviewed by me and considered in my medical decision making (see chart for details).     Viral URI with cough   Likely viral upper respiratory infection. This does not require an antibiotic.  Injection of decadron today, which is a steroid. This will help with inflammation. Promethazine-DM 5 mL every 6 hours as needed for cough. Use caution as it can make you sleepy. Zofran 4mg  dissolve on your tongue every 8 hours as needed for nausea. Continue the mucinex over the counter as needed. Rest and hydrated. Return to urgent care or PCP if symptoms worsen or fail to resolve.    Final Clinical Impressions(s) / UC Diagnoses   Final diagnoses:  Viral URI with cough     Discharge Instructions      Likely viral upper respiratory infection. This does not require an antibiotic.  Injection of decadron today, which is a steroid. This will help with inflammation. Promethazine-DM 5 mL every 6 hours as needed for cough. Use caution as it can make you sleepy. Zofran 4mg  dissolve on your tongue every 8 hours as needed for nausea. Continue the mucinex over the counter as needed. Rest and hydrated. Return to urgent care or PCP if symptoms worsen or fail to resolve.       ED Prescriptions     Medication Sig Dispense Auth. Provider   promethazine-dextromethorphan (PROMETHAZINE-DM) 6.25-15 MG/5ML syrup Take 5 mLs by mouth 4  (four) times daily as needed for cough. 118 mL Lucindy Borel A, PA-C   ondansetron (ZOFRAN-ODT) 4 MG disintegrating tablet Take 1 tablet (4 mg total) by mouth every 8 (eight) hours as needed for nausea or vomiting. 20 tablet Landis Martins, [place]      PDMP not reviewed this encounter.   Landis Martins, [place] 08/06/23 1318

## 2023-08-06 NOTE — Discharge Instructions (Addendum)
Likely viral upper respiratory infection. This does not require an antibiotic.  Injection of decadron today, which is a steroid. This will help with inflammation. Promethazine-DM 5 mL every 6 hours as needed for cough. Use caution as it can make you sleepy. Zofran 4mg  dissolve on your tongue every 8 hours as needed for nausea. Continue the mucinex over the counter as needed. Rest and hydrated. Return to urgent care or PCP if symptoms worsen or fail to resolve.

## 2023-08-06 NOTE — ED Triage Notes (Signed)
Pt states that she has a cough and has been running a fever. X1 week

## 2023-08-10 ENCOUNTER — Encounter (HOSPITAL_COMMUNITY): Payer: Self-pay

## 2023-08-10 ENCOUNTER — Ambulatory Visit (HOSPITAL_COMMUNITY)
Admission: EM | Admit: 2023-08-10 | Discharge: 2023-08-10 | Disposition: A | Discharge status: Home / Self Care | Payer: BC Managed Care – PPO | Attending: Family Medicine | Admitting: Family Medicine

## 2023-08-10 ENCOUNTER — Ambulatory Visit (INDEPENDENT_AMBULATORY_CARE_PROVIDER_SITE_OTHER): Payer: BC Managed Care – PPO

## 2023-08-10 DIAGNOSIS — J40 Bronchitis, not specified as acute or chronic: Secondary | ICD-10-CM | POA: Diagnosis not present

## 2023-08-10 DIAGNOSIS — R0602 Shortness of breath: Secondary | ICD-10-CM | POA: Diagnosis not present

## 2023-08-10 DIAGNOSIS — J189 Pneumonia, unspecified organism: Secondary | ICD-10-CM

## 2023-08-10 DIAGNOSIS — R059 Cough, unspecified: Secondary | ICD-10-CM | POA: Diagnosis not present

## 2023-08-10 MED ORDER — DOXYCYCLINE HYCLATE 100 MG PO CAPS: 100.0000 mg | ORAL_CAPSULE | Freq: Two times a day (BID) | ORAL | 0 refills | Status: AC

## 2023-08-10 MED ORDER — ALBUTEROL SULFATE HFA 108 (90 BASE) MCG/ACT IN AERS: 2.0000 | INHALATION_SPRAY | RESPIRATORY_TRACT | 0 refills | Status: AC | PRN

## 2023-08-10 NOTE — ED Triage Notes (Signed)
Patient reports that she began having a fever, cough, and SOB 5 days ago. Patient was seen on 08/06/23 for the same. Patient states she has taken all of the medication and not helping.

## 2023-08-10 NOTE — Discharge Instructions (Signed)
You were seen today for continued upper respiratory symptoms.  Your xray is concerning for a possible pneumonia.  I have sent out an antibiotic for this, as well as an albuterol inhaler to help with wheezing and shortness of breath.  You may continue over the counter medications for cough as well.  Please return if not improving.

## 2023-08-10 NOTE — ED Provider Notes (Signed)
[facility]    CSN: [id number] Arrival date & time: 08/10/23  0848      History   Chief Complaint Chief Complaint  Patient presents with   Cough   Fever   Shortness of Breath    HPI Alice Dominguez is a 47 y.o. female.    Cough Associated symptoms: fever and shortness of breath   Fever Associated symptoms: cough   Shortness of Breath Associated symptoms: cough and fever    Patient is here for fever and cough  for almost 2 weeks.  She was here several days ago, and given steroids, cough medication without much help.  She is not checking her temp, but feeling hot/cold.  She is having a cough, some wheezing, sob.  She does have asthma, so that plays a part. She does have an inhaler, but ran out of that.  Needs a refill.  She feels a lot of phlegm in her chest.  No sinus congestion/drainage.       Past Medical History:  Diagnosis Date   Asthma    Seasonal allergies     There are no problems to display for this patient.   Past Surgical History:  Procedure Laterality Date   DILATION AND EVACUATION     miscarriage   EXCISION MASS HEAD Right 06/20/2022   Procedure: EXCISION SUBFASCIAL MASS HEAD RIGHT SCALP;  Surgeon: Janne Napoleon, MD;  Location: [facility];  Service: Plastics;  Laterality: Right;  1 hour   TONSILLECTOMY      OB History   No obstetric history on file.      Home Medications    Prior to Admission medications   Medication Sig Start Date End Date Taking? Authorizing Provider  Acetaminophen-guaiFENesin [facility] COLD & FLU PO) Take by mouth.    [provider]  betamethasone dipropionate 0.05 % cream Apply topically 2 (two) times daily. 01/11/22   [provider]  budesonide (PULMICORT FLEXHALER) 180 MCG/ACT inhaler 1 puff 02/06/18   [provider]  EPINEPHrine 0.3 mg/0.3 mL IJ SOAJ injection Inject into the muscle. 11/08/21   [provider]  fluticasone Aleda Grana) 50  MCG/ACT nasal spray SMARTSIG:1-2 Spray(s) Both Nares Daily 11/08/21   [provider]  hydrocortisone cream 0.5 % Apply 1 application topically 2 (two) times daily. 12/23/20   Raspet, Noberto Retort, PA-C  Olopatadine HCl (PATADAY) 0.7 % SOLN 1 drop into affected eye    [provider]  ondansetron (ZOFRAN-ODT) 4 MG disintegrating tablet Take 1 tablet (4 mg total) by mouth every 8 (eight) hours as needed for nausea or vomiting. 08/06/23   White, Elizabeth A, PA-C  promethazine-dextromethorphan (PROMETHAZINE-DM) 6.25-15 MG/5ML syrup Take 5 mLs by mouth 4 (four) times daily as needed for cough. 08/06/23   Landis Martins, PA-C    Family History Family History  Problem Relation Age of Onset   Breast cancer Maternal Grandmother    Healthy Mother    Healthy Father     Social History Social History   Tobacco Use   Smoking status: Never   Smokeless tobacco: Never  Vaping Use   Vaping status: Never Used  Substance Use Topics   Alcohol use: Never   Drug use: Never     Allergies   Barley green [barley grass], Cat hair extract, Grass pollen([id number] vern), Other, Molds & smuts, and Penicillins   Review of Systems Review of Systems  Constitutional:  Positive for fever.  HENT: Negative.    Respiratory:  Positive for cough and shortness of breath.   Cardiovascular: Negative.   Gastrointestinal: Negative.   Musculoskeletal: Negative.   Psychiatric/Behavioral: Negative.       Physical Exam Triage Vital Signs ED Triage Vitals [08/10/23 0857]  Encounter Vitals Group     BP 118/75     Systolic BP Percentile      Diastolic BP Percentile      Pulse Rate (!) 103     Resp 18     Temp 99 F (37.2 C)     Temp Source Oral     SpO2 93 %     Weight      Height      Head Circumference      Peak Flow      Pain Score 0     Pain Loc      Pain Education      Exclude from Growth Chart    No data found.  Updated Vital Signs BP 118/75 (BP Location: Left Arm)   Pulse  (!) 103   Temp 99 F (37.2 C) (Oral)   Resp 18   LMP 07/22/2023   SpO2 93%   Visual Acuity Right Eye Distance:   Left Eye Distance:   Bilateral Distance:    Right Eye Near:   Left Eye Near:    Bilateral Near:     Physical Exam Constitutional:      Appearance: She is well-developed.  Eyes:     Pupils: Pupils are equal, round, and reactive to light.  Cardiovascular:     Rate and Rhythm: Normal rate and regular rhythm.  Pulmonary:     Effort: Pulmonary effort is normal.     Breath sounds: Normal breath sounds.  Musculoskeletal:        General: Normal range of motion.     Cervical back: Normal range of motion and neck supple.  Lymphadenopathy:     Cervical: No cervical adenopathy.  Skin:    General: Skin is warm.  Neurological:     General: No focal deficit present.     Mental Status: She is alert.  Psychiatric:        Mood and Affect: Mood normal.      UC Treatments / Results  Labs (all labs ordered are listed, but only abnormal results are displayed) Labs Reviewed - No data to display  EKG   Radiology No results found.  Procedures Procedures (including critical care time)  Medications Ordered in UC Medications - No data to display  Initial Impression / Assessment and Plan / UC Course  I have reviewed the triage vital signs and the nursing notes.  Pertinent labs & imaging results that were available during my care of the patient were reviewed by me and considered in my medical decision making (see chart for details).   Final Clinical Impressions(s) / UC Diagnoses   Final diagnoses:  Pneumonia of left lower lobe due to infectious organism     Discharge Instructions      You were seen today for continued upper respiratory symptoms.  Your xray is concerning for a possible pneumonia.  I have sent out an antibiotic for this, as well as an albuterol inhaler to help with wheezing and shortness of breath.  You may continue over the counter medications  for cough as well.  Please return if not improving.     ED Prescriptions     Medication Sig Dispense Auth. Provider   doxycycline (VIBRAMYCIN) 100 MG capsule Take 1  capsule (100 mg total) by mouth 2 (two) times daily. 20 capsule Gilbert Manolis, MD   albuterol (VENTOLIN HFA) 108 (90 Base) MCG/ACT inhaler Inhale 2 puffs into the lungs every 4 (four) hours as needed for wheezing or shortness of breath. 1 each Jannifer Franklin, MD      PDMP not reviewed this encounter.   Jannifer Franklin, MD 08/10/23 [phone]

## 2023-08-17 DIAGNOSIS — J301 Allergic rhinitis due to pollen: Secondary | ICD-10-CM | POA: Diagnosis not present

## 2023-08-17 DIAGNOSIS — J3089 Other allergic rhinitis: Secondary | ICD-10-CM | POA: Diagnosis not present

## 2023-08-17 DIAGNOSIS — J3081 Allergic rhinitis due to animal (cat) (dog) hair and dander: Secondary | ICD-10-CM | POA: Diagnosis not present

## 2023-08-29 DIAGNOSIS — J3089 Other allergic rhinitis: Secondary | ICD-10-CM | POA: Diagnosis not present

## 2023-08-29 DIAGNOSIS — J301 Allergic rhinitis due to pollen: Secondary | ICD-10-CM | POA: Diagnosis not present

## 2023-08-29 DIAGNOSIS — J3081 Allergic rhinitis due to animal (cat) (dog) hair and dander: Secondary | ICD-10-CM | POA: Diagnosis not present

## 2023-09-07 DIAGNOSIS — J3089 Other allergic rhinitis: Secondary | ICD-10-CM | POA: Diagnosis not present

## 2023-09-07 DIAGNOSIS — J301 Allergic rhinitis due to pollen: Secondary | ICD-10-CM | POA: Diagnosis not present

## 2023-09-07 DIAGNOSIS — J3081 Allergic rhinitis due to animal (cat) (dog) hair and dander: Secondary | ICD-10-CM | POA: Diagnosis not present

## 2023-09-14 DIAGNOSIS — J301 Allergic rhinitis due to pollen: Secondary | ICD-10-CM | POA: Diagnosis not present

## 2023-09-14 DIAGNOSIS — J3089 Other allergic rhinitis: Secondary | ICD-10-CM | POA: Diagnosis not present

## 2023-09-14 DIAGNOSIS — J3081 Allergic rhinitis due to animal (cat) (dog) hair and dander: Secondary | ICD-10-CM | POA: Diagnosis not present

## 2023-09-21 DIAGNOSIS — J3089 Other allergic rhinitis: Secondary | ICD-10-CM | POA: Diagnosis not present

## 2023-09-21 DIAGNOSIS — J301 Allergic rhinitis due to pollen: Secondary | ICD-10-CM | POA: Diagnosis not present

## 2023-09-21 DIAGNOSIS — J3081 Allergic rhinitis due to animal (cat) (dog) hair and dander: Secondary | ICD-10-CM | POA: Diagnosis not present

## 2023-09-28 DIAGNOSIS — J301 Allergic rhinitis due to pollen: Secondary | ICD-10-CM | POA: Diagnosis not present

## 2023-09-28 DIAGNOSIS — J3081 Allergic rhinitis due to animal (cat) (dog) hair and dander: Secondary | ICD-10-CM | POA: Diagnosis not present

## 2023-09-28 DIAGNOSIS — J3089 Other allergic rhinitis: Secondary | ICD-10-CM | POA: Diagnosis not present

## 2023-10-05 DIAGNOSIS — J3081 Allergic rhinitis due to animal (cat) (dog) hair and dander: Secondary | ICD-10-CM | POA: Diagnosis not present

## 2023-10-05 DIAGNOSIS — J3089 Other allergic rhinitis: Secondary | ICD-10-CM | POA: Diagnosis not present

## 2023-10-05 DIAGNOSIS — J301 Allergic rhinitis due to pollen: Secondary | ICD-10-CM | POA: Diagnosis not present

## 2023-10-12 DIAGNOSIS — J301 Allergic rhinitis due to pollen: Secondary | ICD-10-CM | POA: Diagnosis not present

## 2023-10-12 DIAGNOSIS — J3089 Other allergic rhinitis: Secondary | ICD-10-CM | POA: Diagnosis not present

## 2023-10-12 DIAGNOSIS — J3081 Allergic rhinitis due to animal (cat) (dog) hair and dander: Secondary | ICD-10-CM | POA: Diagnosis not present

## 2023-10-19 DIAGNOSIS — J3089 Other allergic rhinitis: Secondary | ICD-10-CM | POA: Diagnosis not present

## 2023-10-19 DIAGNOSIS — J3081 Allergic rhinitis due to animal (cat) (dog) hair and dander: Secondary | ICD-10-CM | POA: Diagnosis not present

## 2023-10-19 DIAGNOSIS — J301 Allergic rhinitis due to pollen: Secondary | ICD-10-CM | POA: Diagnosis not present

## 2023-10-26 DIAGNOSIS — J3081 Allergic rhinitis due to animal (cat) (dog) hair and dander: Secondary | ICD-10-CM | POA: Diagnosis not present

## 2023-10-26 DIAGNOSIS — J3089 Other allergic rhinitis: Secondary | ICD-10-CM | POA: Diagnosis not present

## 2023-10-26 DIAGNOSIS — J301 Allergic rhinitis due to pollen: Secondary | ICD-10-CM | POA: Diagnosis not present

## 2023-10-27 DIAGNOSIS — Z0000 Encounter for general adult medical examination without abnormal findings: Secondary | ICD-10-CM | POA: Diagnosis not present

## 2023-10-27 DIAGNOSIS — K921 Melena: Secondary | ICD-10-CM | POA: Diagnosis not present

## 2023-10-27 DIAGNOSIS — H8303 Labyrinthitis, bilateral: Secondary | ICD-10-CM | POA: Diagnosis not present

## 2023-10-27 DIAGNOSIS — H9193 Unspecified hearing loss, bilateral: Secondary | ICD-10-CM | POA: Diagnosis not present

## 2023-10-27 DIAGNOSIS — Z13220 Encounter for screening for lipoid disorders: Secondary | ICD-10-CM | POA: Diagnosis not present

## 2023-10-27 DIAGNOSIS — Z114 Encounter for screening for human immunodeficiency virus [HIV]: Secondary | ICD-10-CM | POA: Diagnosis not present

## 2023-10-27 DIAGNOSIS — Z1159 Encounter for screening for other viral diseases: Secondary | ICD-10-CM | POA: Diagnosis not present

## 2023-10-30 DIAGNOSIS — K921 Melena: Secondary | ICD-10-CM | POA: Diagnosis not present

## 2023-11-02 DIAGNOSIS — J301 Allergic rhinitis due to pollen: Secondary | ICD-10-CM | POA: Diagnosis not present

## 2023-11-02 DIAGNOSIS — J3081 Allergic rhinitis due to animal (cat) (dog) hair and dander: Secondary | ICD-10-CM | POA: Diagnosis not present

## 2023-11-02 DIAGNOSIS — J3089 Other allergic rhinitis: Secondary | ICD-10-CM | POA: Diagnosis not present

## 2023-11-09 DIAGNOSIS — J301 Allergic rhinitis due to pollen: Secondary | ICD-10-CM | POA: Diagnosis not present

## 2023-11-09 DIAGNOSIS — J3089 Other allergic rhinitis: Secondary | ICD-10-CM | POA: Diagnosis not present

## 2023-11-09 DIAGNOSIS — J3081 Allergic rhinitis due to animal (cat) (dog) hair and dander: Secondary | ICD-10-CM | POA: Diagnosis not present

## 2023-11-20 DIAGNOSIS — J3081 Allergic rhinitis due to animal (cat) (dog) hair and dander: Secondary | ICD-10-CM | POA: Diagnosis not present

## 2023-11-20 DIAGNOSIS — Z91018 Allergy to other foods: Secondary | ICD-10-CM | POA: Diagnosis not present

## 2023-11-20 DIAGNOSIS — J301 Allergic rhinitis due to pollen: Secondary | ICD-10-CM | POA: Diagnosis not present

## 2023-11-20 DIAGNOSIS — H1045 Other chronic allergic conjunctivitis: Secondary | ICD-10-CM | POA: Diagnosis not present

## 2023-11-20 DIAGNOSIS — J4530 Mild persistent asthma, uncomplicated: Secondary | ICD-10-CM | POA: Diagnosis not present

## 2023-11-20 DIAGNOSIS — J3089 Other allergic rhinitis: Secondary | ICD-10-CM | POA: Diagnosis not present

## 2023-11-20 DIAGNOSIS — R21 Rash and other nonspecific skin eruption: Secondary | ICD-10-CM | POA: Diagnosis not present

## 2023-11-29 DIAGNOSIS — J301 Allergic rhinitis due to pollen: Secondary | ICD-10-CM | POA: Diagnosis not present

## 2023-11-29 DIAGNOSIS — J3089 Other allergic rhinitis: Secondary | ICD-10-CM | POA: Diagnosis not present

## 2023-11-29 DIAGNOSIS — J3081 Allergic rhinitis due to animal (cat) (dog) hair and dander: Secondary | ICD-10-CM | POA: Diagnosis not present

## 2023-12-07 DIAGNOSIS — J3081 Allergic rhinitis due to animal (cat) (dog) hair and dander: Secondary | ICD-10-CM | POA: Diagnosis not present

## 2023-12-07 DIAGNOSIS — J301 Allergic rhinitis due to pollen: Secondary | ICD-10-CM | POA: Diagnosis not present

## 2023-12-07 DIAGNOSIS — J3089 Other allergic rhinitis: Secondary | ICD-10-CM | POA: Diagnosis not present

## 2023-12-14 DIAGNOSIS — J301 Allergic rhinitis due to pollen: Secondary | ICD-10-CM | POA: Diagnosis not present

## 2023-12-14 DIAGNOSIS — J3089 Other allergic rhinitis: Secondary | ICD-10-CM | POA: Diagnosis not present

## 2023-12-14 DIAGNOSIS — J3081 Allergic rhinitis due to animal (cat) (dog) hair and dander: Secondary | ICD-10-CM | POA: Diagnosis not present

## 2023-12-18 DIAGNOSIS — L409 Psoriasis, unspecified: Secondary | ICD-10-CM | POA: Diagnosis not present

## 2023-12-18 DIAGNOSIS — B351 Tinea unguium: Secondary | ICD-10-CM | POA: Diagnosis not present

## 2023-12-18 DIAGNOSIS — H833X3 Noise effects on inner ear, bilateral: Secondary | ICD-10-CM | POA: Diagnosis not present

## 2023-12-18 DIAGNOSIS — H6993 Unspecified Eustachian tube disorder, bilateral: Secondary | ICD-10-CM | POA: Diagnosis not present

## 2023-12-21 DIAGNOSIS — J301 Allergic rhinitis due to pollen: Secondary | ICD-10-CM | POA: Diagnosis not present

## 2023-12-21 DIAGNOSIS — J3081 Allergic rhinitis due to animal (cat) (dog) hair and dander: Secondary | ICD-10-CM | POA: Diagnosis not present

## 2023-12-21 DIAGNOSIS — J3089 Other allergic rhinitis: Secondary | ICD-10-CM | POA: Diagnosis not present

## 2023-12-28 DIAGNOSIS — J3081 Allergic rhinitis due to animal (cat) (dog) hair and dander: Secondary | ICD-10-CM | POA: Diagnosis not present

## 2023-12-28 DIAGNOSIS — J301 Allergic rhinitis due to pollen: Secondary | ICD-10-CM | POA: Diagnosis not present

## 2023-12-28 DIAGNOSIS — J3089 Other allergic rhinitis: Secondary | ICD-10-CM | POA: Diagnosis not present

## 2024-01-05 DIAGNOSIS — J301 Allergic rhinitis due to pollen: Secondary | ICD-10-CM | POA: Diagnosis not present

## 2024-01-05 DIAGNOSIS — J3081 Allergic rhinitis due to animal (cat) (dog) hair and dander: Secondary | ICD-10-CM | POA: Diagnosis not present

## 2024-01-05 DIAGNOSIS — J3089 Other allergic rhinitis: Secondary | ICD-10-CM | POA: Diagnosis not present

## 2024-01-18 ENCOUNTER — Other Ambulatory Visit: Payer: Self-pay

## 2024-01-18 DIAGNOSIS — J3089 Other allergic rhinitis: Secondary | ICD-10-CM | POA: Diagnosis not present

## 2024-01-18 DIAGNOSIS — Z1231 Encounter for screening mammogram for malignant neoplasm of breast: Secondary | ICD-10-CM

## 2024-01-18 DIAGNOSIS — J301 Allergic rhinitis due to pollen: Secondary | ICD-10-CM | POA: Diagnosis not present

## 2024-01-18 DIAGNOSIS — J3081 Allergic rhinitis due to animal (cat) (dog) hair and dander: Secondary | ICD-10-CM | POA: Diagnosis not present

## 2024-01-25 DIAGNOSIS — J3081 Allergic rhinitis due to animal (cat) (dog) hair and dander: Secondary | ICD-10-CM | POA: Diagnosis not present

## 2024-01-25 DIAGNOSIS — J301 Allergic rhinitis due to pollen: Secondary | ICD-10-CM | POA: Diagnosis not present

## 2024-01-25 DIAGNOSIS — J3089 Other allergic rhinitis: Secondary | ICD-10-CM | POA: Diagnosis not present

## 2024-01-31 DIAGNOSIS — H9313 Tinnitus, bilateral: Secondary | ICD-10-CM | POA: Diagnosis not present

## 2024-02-08 DIAGNOSIS — J3081 Allergic rhinitis due to animal (cat) (dog) hair and dander: Secondary | ICD-10-CM | POA: Diagnosis not present

## 2024-02-08 DIAGNOSIS — J301 Allergic rhinitis due to pollen: Secondary | ICD-10-CM | POA: Diagnosis not present

## 2024-02-08 DIAGNOSIS — J3089 Other allergic rhinitis: Secondary | ICD-10-CM | POA: Diagnosis not present

## 2024-02-15 DIAGNOSIS — J301 Allergic rhinitis due to pollen: Secondary | ICD-10-CM | POA: Diagnosis not present

## 2024-02-15 DIAGNOSIS — J3089 Other allergic rhinitis: Secondary | ICD-10-CM | POA: Diagnosis not present

## 2024-02-15 DIAGNOSIS — J3081 Allergic rhinitis due to animal (cat) (dog) hair and dander: Secondary | ICD-10-CM | POA: Diagnosis not present

## 2024-02-20 ENCOUNTER — Ambulatory Visit
Admission: RE | Admit: 2024-02-20 | Discharge: 2024-02-20 | Disposition: A | Discharge status: Home / Self Care | Source: Ambulatory Visit

## 2024-02-20 DIAGNOSIS — Z1231 Encounter for screening mammogram for malignant neoplasm of breast: Secondary | ICD-10-CM

## 2024-02-22 DIAGNOSIS — J301 Allergic rhinitis due to pollen: Secondary | ICD-10-CM | POA: Diagnosis not present

## 2024-02-22 DIAGNOSIS — J3081 Allergic rhinitis due to animal (cat) (dog) hair and dander: Secondary | ICD-10-CM | POA: Diagnosis not present

## 2024-02-22 DIAGNOSIS — J3089 Other allergic rhinitis: Secondary | ICD-10-CM | POA: Diagnosis not present

## 2024-02-29 DIAGNOSIS — J3089 Other allergic rhinitis: Secondary | ICD-10-CM | POA: Diagnosis not present

## 2024-02-29 DIAGNOSIS — J3081 Allergic rhinitis due to animal (cat) (dog) hair and dander: Secondary | ICD-10-CM | POA: Diagnosis not present

## 2024-02-29 DIAGNOSIS — J301 Allergic rhinitis due to pollen: Secondary | ICD-10-CM | POA: Diagnosis not present

## 2024-03-07 DIAGNOSIS — J3081 Allergic rhinitis due to animal (cat) (dog) hair and dander: Secondary | ICD-10-CM | POA: Diagnosis not present

## 2024-03-07 DIAGNOSIS — J3089 Other allergic rhinitis: Secondary | ICD-10-CM | POA: Diagnosis not present

## 2024-03-07 DIAGNOSIS — J301 Allergic rhinitis due to pollen: Secondary | ICD-10-CM | POA: Diagnosis not present

## 2024-03-25 DIAGNOSIS — J3081 Allergic rhinitis due to animal (cat) (dog) hair and dander: Secondary | ICD-10-CM | POA: Diagnosis not present

## 2024-03-25 DIAGNOSIS — J301 Allergic rhinitis due to pollen: Secondary | ICD-10-CM | POA: Diagnosis not present

## 2024-03-25 DIAGNOSIS — J3089 Other allergic rhinitis: Secondary | ICD-10-CM | POA: Diagnosis not present
